# Patient Record
Sex: Male | Born: 1970 | Race: Black or African American | Hispanic: No | Marital: Married | Smoking: Never smoker
Health system: Southern US, Community
[De-identification: ages and names within clinical notes are randomized; demographics above are authoritative.]

## PROBLEM LIST (undated history)

## (undated) DIAGNOSIS — E785 Hyperlipidemia, unspecified: Secondary | ICD-10-CM

## (undated) DIAGNOSIS — Z8249 Family history of ischemic heart disease and other diseases of the circulatory system: Secondary | ICD-10-CM

## (undated) DIAGNOSIS — I2584 Coronary atherosclerosis due to calcified coronary lesion: Secondary | ICD-10-CM

## (undated) DIAGNOSIS — K429 Umbilical hernia without obstruction or gangrene: Secondary | ICD-10-CM

## (undated) DIAGNOSIS — I10 Essential (primary) hypertension: Secondary | ICD-10-CM

## (undated) DIAGNOSIS — K439 Ventral hernia without obstruction or gangrene: Secondary | ICD-10-CM

## (undated) DIAGNOSIS — K219 Gastro-esophageal reflux disease without esophagitis: Secondary | ICD-10-CM

## (undated) DIAGNOSIS — I340 Nonrheumatic mitral (valve) insufficiency: Secondary | ICD-10-CM

## (undated) DIAGNOSIS — I493 Ventricular premature depolarization: Secondary | ICD-10-CM

## (undated) DIAGNOSIS — G4733 Obstructive sleep apnea (adult) (pediatric): Secondary | ICD-10-CM

## (undated) DIAGNOSIS — M5020 Other cervical disc displacement, unspecified cervical region: Secondary | ICD-10-CM

## (undated) DIAGNOSIS — D171 Benign lipomatous neoplasm of skin and subcutaneous tissue of trunk: Secondary | ICD-10-CM

## (undated) DIAGNOSIS — I2510 Atherosclerotic heart disease of native coronary artery without angina pectoris: Secondary | ICD-10-CM

## (undated) HISTORY — DX: Hyperlipidemia, unspecified: E78.5

## (undated) HISTORY — PX: NO PAST SURGERIES: SHX2092

## (undated) HISTORY — DX: Family history of ischemic heart disease and other diseases of the circulatory system: Z82.49

## (undated) HISTORY — DX: Atherosclerotic heart disease of native coronary artery without angina pectoris: I25.10

## (undated) HISTORY — DX: Ventricular premature depolarization: I49.3

## (undated) HISTORY — DX: Coronary atherosclerosis due to calcified coronary lesion: I25.84

## (undated) HISTORY — DX: Nonrheumatic mitral (valve) insufficiency: I34.0

## (undated) HISTORY — DX: Obstructive sleep apnea (adult) (pediatric): G47.33

---

## 2017-03-04 DIAGNOSIS — K439 Ventral hernia without obstruction or gangrene: Secondary | ICD-10-CM | POA: Insufficient documentation

## 2017-03-04 DIAGNOSIS — Z79899 Other long term (current) drug therapy: Secondary | ICD-10-CM | POA: Diagnosis not present

## 2017-03-04 DIAGNOSIS — I10 Essential (primary) hypertension: Secondary | ICD-10-CM | POA: Diagnosis not present

## 2017-03-04 DIAGNOSIS — R1084 Generalized abdominal pain: Secondary | ICD-10-CM | POA: Diagnosis present

## 2017-03-05 ENCOUNTER — Emergency Department (HOSPITAL_COMMUNITY)
Admission: EM | Admit: 2017-03-05 | Discharge: 2017-03-05 | Disposition: A | Discharge status: Home / Self Care | Payer: BLUE CROSS/BLUE SHIELD | Attending: Emergency Medicine | Admitting: Emergency Medicine

## 2017-03-05 ENCOUNTER — Encounter (HOSPITAL_COMMUNITY): Payer: Self-pay | Admitting: Emergency Medicine

## 2017-03-05 ENCOUNTER — Emergency Department (HOSPITAL_COMMUNITY): Payer: BLUE CROSS/BLUE SHIELD

## 2017-03-05 DIAGNOSIS — R1084 Generalized abdominal pain: Secondary | ICD-10-CM

## 2017-03-05 DIAGNOSIS — K439 Ventral hernia without obstruction or gangrene: Secondary | ICD-10-CM

## 2017-03-05 HISTORY — DX: Essential (primary) hypertension: I10

## 2017-03-05 LAB — I-STAT CG4 LACTIC ACID, ED: LACTIC ACID, VENOUS: 1.05 mmol/L (ref 0.5–1.9)

## 2017-03-05 LAB — CBC
HCT: 37.1 % — ABNORMAL LOW (ref 39.0–52.0)
HEMOGLOBIN: 12.6 g/dL — AB (ref 13.0–17.0)
MCH: 28.3 pg (ref 26.0–34.0)
MCHC: 34 g/dL (ref 30.0–36.0)
MCV: 83.4 fL (ref 78.0–100.0)
Platelets: 238 10*3/uL (ref 150–400)
RBC: 4.45 MIL/uL (ref 4.22–5.81)
RDW: 14.2 % (ref 11.5–15.5)
WBC: 6.1 10*3/uL (ref 4.0–10.5)

## 2017-03-05 LAB — URINALYSIS, ROUTINE W REFLEX MICROSCOPIC
BILIRUBIN URINE: NEGATIVE
Glucose, UA: NEGATIVE mg/dL
Hgb urine dipstick: NEGATIVE
Ketones, ur: NEGATIVE mg/dL
LEUKOCYTES UA: NEGATIVE
NITRITE: NEGATIVE
Protein, ur: NEGATIVE mg/dL
SPECIFIC GRAVITY, URINE: 1.02 (ref 1.005–1.030)
pH: 6 (ref 5.0–8.0)

## 2017-03-05 LAB — COMPREHENSIVE METABOLIC PANEL
ALK PHOS: 33 U/L — AB (ref 38–126)
ALT: 30 U/L (ref 17–63)
ANION GAP: 8 (ref 5–15)
AST: 32 U/L (ref 15–41)
Albumin: 4.5 g/dL (ref 3.5–5.0)
BILIRUBIN TOTAL: 0.8 mg/dL (ref 0.3–1.2)
BUN: 18 mg/dL (ref 6–20)
CALCIUM: 9.3 mg/dL (ref 8.9–10.3)
CO2: 27 mmol/L (ref 22–32)
Chloride: 102 mmol/L (ref 101–111)
Creatinine, Ser: 1.02 mg/dL (ref 0.61–1.24)
GFR calc non Af Amer: >60 mL/min (ref >60)
Glucose, Bld: 117 mg/dL — ABNORMAL HIGH (ref 65–99)
Potassium: 3.5 mmol/L (ref 3.5–5.1)
Sodium: 137 mmol/L (ref 135–145)
TOTAL PROTEIN: 7.4 g/dL (ref 6.5–8.1)

## 2017-03-05 LAB — LIPASE, BLOOD: Lipase: 23 U/L (ref 11–51)

## 2017-03-05 MED ORDER — IOPAMIDOL (ISOVUE-300) INJECTION 61%
INTRAVENOUS | Status: AC
  Filled 2017-03-05: qty 100

## 2017-03-05 MED ORDER — HYDROCODONE-ACETAMINOPHEN 5-325 MG PO TABS: 1.0000 | ORAL_TABLET | Freq: Four times a day (QID) | ORAL | 0 refills | Status: DC | PRN

## 2017-03-05 MED ORDER — OXYCODONE-ACETAMINOPHEN 5-325 MG PO TABS
2.0000 | ORAL_TABLET | Freq: Once | ORAL | Status: AC
  Administered 2017-03-05: 2 via ORAL
  Filled 2017-03-05: qty 2

## 2017-03-05 MED ORDER — MORPHINE SULFATE (PF) 2 MG/ML IV SOLN
4.0000 mg | Freq: Once | INTRAVENOUS | Status: AC
Start: 2017-03-05 — End: 2017-03-05
  Administered 2017-03-05: 4 mg via INTRAVENOUS
  Filled 2017-03-05: qty 2

## 2017-03-05 MED ORDER — ONDANSETRON 8 MG PO TBDP: ORAL_TABLET | ORAL | 0 refills | Status: DC

## 2017-03-05 MED ORDER — IOPAMIDOL (ISOVUE-300) INJECTION 61%
100.0000 mL | Freq: Once | INTRAVENOUS | Status: AC | PRN
  Administered 2017-03-05: 100 mL via INTRAVENOUS

## 2017-03-05 NOTE — Discharge Instructions (Signed)
1. Medications: zofran, vicodin, usual home medications 2. Treatment: rest, drink plenty of fluids, advance diet slowly 3. Follow Up: Please followup with your primary doctor and general surgery in 2-3 days for discussion of your diagnoses and further evaluation after today's visit; if you do not have a primary care doctor use the resource guide provided to find one; Please return to the ER for persistent vomiting, high fevers, uncontrolled pain, signs of skin infection or worsening symptoms

## 2017-03-05 NOTE — ED Notes (Signed)
Patient transported to CT 

## 2017-03-05 NOTE — ED Provider Notes (Signed)
[facility] DEPT Provider Note   CSN: [id number] Arrival date & time: 03/04/17  2326     History   Chief Complaint Chief Complaint  Patient presents with  . Abdominal Pain    HPI Phillip Fletcher is a 46 y.o. male with a hx of HTN presents to the Emergency Department complaining of gradual, persistent, progressively worsening generalized abdominal pain onset 7pm.  Pt reports his pain is sharp and stabbing rated at a "20/10."  Pt reports he was running when the pain began.  He states it is normal for his to run.  Pt reports a known umbilical hernia, without previous complications.  He reports no international travel, sick contacts, or abd surgeries.  No hx of diverticulitis or SBO.  Pt reports no treatments PTA.  No melena, hematochezia, hematuria, dysuria, painful BMs, constipation.  Nothing makes the symptoms better or worse. Pt reports feeling of abd distension.       The history is provided by the patient and medical records. No language interpreter was used.    Past Medical History:  Diagnosis Date  . Hypertension     There are no active problems to display for this patient.   No past surgical history on file.     Home Medications    Prior to Admission medications   Medication Sig Start Date End Date Taking? Authorizing Provider  amLODipine (NORVASC) 10 MG tablet Take 1 tablet by mouth daily. 02/22/17  Yes [provider]  atorvastatin (LIPITOR) 20 MG tablet Take 1 tablet by mouth daily. 02/22/17  Yes [provider]  gabapentin (NEURONTIN) 300 MG capsule Take 1 capsule by mouth daily. 02/21/17  Yes [provider]  losartan-hydrochlorothiazide (HYZAAR) 100-25 MG tablet Take 1 tablet by mouth daily. 02/23/17  Yes [provider]  meloxicam (MOBIC) 15 MG tablet Take 1 tablet by mouth daily. 02/22/17  Yes [provider]  HYDROcodone-acetaminophen (NORCO/VICODIN) 5-325 MG tablet Take 1-2 tablets by mouth every 6 (six) hours as  needed. 03/05/17   Sherwood Castilla, Jarrett Soho, PA-C  ondansetron (ZOFRAN ODT) 8 MG disintegrating tablet 8mg  ODT q4 hours prn nausea 03/05/17   Pilar Corrales, Jarrett Soho, PA-C    Family History No family history on file.  Social History Social History  Substance Use Topics  . Smoking status: Never Smoker  . Smokeless tobacco: Never Used  . Alcohol use No     Allergies   Patient has no known allergies.   Review of Systems Review of Systems  Constitutional: Negative for appetite change, diaphoresis, fatigue, fever and unexpected weight change.  HENT: Negative for mouth sores.   Eyes: Negative for visual disturbance.  Respiratory: Negative for cough, chest tightness, shortness of breath and wheezing.   Cardiovascular: Negative for chest pain.  Gastrointestinal: Positive for abdominal distention and abdominal pain. Negative for constipation, diarrhea, nausea and vomiting.  Endocrine: Negative for polydipsia, polyphagia and polyuria.  Genitourinary: Negative for dysuria, frequency, hematuria and urgency.  Musculoskeletal: Negative for back pain and neck stiffness.  Skin: Negative for rash.  Allergic/Immunologic: Negative for immunocompromised state.  Neurological: Negative for syncope, light-headedness and headaches.  Hematological: Does not bruise/bleed easily.  Psychiatric/Behavioral: Negative for sleep disturbance. The patient is not nervous/anxious.   All other systems reviewed and are negative.    Physical Exam Updated Vital Signs BP (!) 170/115 (BP Location: Left Arm)   Pulse 98   Temp 99 F (37.2 C) (Oral)   Resp 18   Ht 5\' 11"  (1.803 m)   Wt 99.8  kg (220 lb)   SpO2 100%   BMI 30.68 kg/m   Physical Exam  Constitutional: He appears well-developed and well-nourished. He appears distressed ( pt appears uncomfortable, pacing around the room).  Awake, alert, nontoxic appearance  HENT:  Head: Normocephalic and atraumatic.  Mouth/Throat: Oropharynx is clear and moist. No  oropharyngeal exudate.  Eyes: Conjunctivae are normal. No scleral icterus.  Neck: Normal range of motion. Neck supple.  Cardiovascular: Normal rate, regular rhythm and intact distal pulses.   Pulmonary/Chest: Effort normal and breath sounds normal. No respiratory distress. He has no wheezes.  Equal chest expansion  Abdominal: Soft. Bowel sounds are normal. He exhibits no mass. There is tenderness in the periumbilical area. There is no rigidity, no rebound, no guarding, no CVA tenderness, no tenderness at McBurney's point and negative Murphy's sign.    Musculoskeletal: Normal range of motion. He exhibits no edema.  Neurological: He is alert.  Speech is clear and goal oriented Moves extremities without ataxia  Skin: Skin is warm and dry. He is not diaphoretic.  Psychiatric: He has a normal mood and affect.  Nursing note and vitals reviewed.    ED Treatments / Results  Labs (all labs ordered are listed, but only abnormal results are displayed) Labs Reviewed  COMPREHENSIVE METABOLIC PANEL - Abnormal; Notable for the following:       Result Value   Glucose, Bld 117 (*)    Alkaline Phosphatase 33 (*)    All other components within normal limits  CBC - Abnormal; Notable for the following:    Hemoglobin 12.6 (*)    HCT 37.1 (*)    All other components within normal limits  LIPASE, BLOOD  URINALYSIS, ROUTINE W REFLEX MICROSCOPIC  I-STAT CG4 LACTIC ACID, ED    Radiology Ct Abdomen Pelvis W Contrast  Result Date: 03/05/2017 CLINICAL DATA:  Abdominal pain.  Abdominal wall hernia. EXAM: CT ABDOMEN AND PELVIS WITH CONTRAST TECHNIQUE: Multidetector CT imaging of the abdomen and pelvis was performed using the standard protocol following bolus administration of intravenous contrast. CONTRAST:  14mL ISOVUE-300 IOPAMIDOL (ISOVUE-300) INJECTION 61% COMPARISON:  None. FINDINGS: Lower chest: Hypoventilatory changes at the lung bases. No pleural fluid. No consolidation. Hepatobiliary: No focal  liver abnormality is seen. No gallstones, gallbladder wall thickening, or biliary dilatation. Pancreas: No ductal dilatation or inflammation. Spleen: Normal in size without focal abnormality. Adrenals/Urinary Tract: Normal adrenal glands. No hydronephrosis or perinephric edema. Homogeneous renal enhancement with symmetric excretion on delayed phase imaging. Urinary bladder is minimally distended. Stomach/Bowel: Stomach is physiologically distended. No bowel obstruction or inflammation. Fecalization of distal small bowel contents suggest slow transit. Small to moderate colonic stool burden. Minimal diverticulosis of the sigmoid colon without diverticulitis. Normal appendix. Vascular/Lymphatic: No significant vascular findings are present. No enlarged abdominal or pelvic lymph nodes. Reproductive: Prostate is unremarkable. Other: Small fat containing umbilical hernia. Midline supraumbilical ventral abdominal wall hernia contains fat and possibly mesenteric vessels, no bowel involvement. Mild surrounding soft tissue inflammation. Hernia neck measures 6 mm. No free air. No ascites. Normal fat in both inguinal canals. Musculoskeletal: There are no acute or suspicious osseous abnormalities. IMPRESSION: 1. Small midline supraumbilical ventral abdominal wall hernia contains fat and possibly mesenteric vessels. No bowel involvement. Minimal surrounding soft tissue stranding consistent with inflammation. 2. Tiny fat containing umbilical hernia. 3. Minimal colonic diverticulosis without acute inflammation. Electronically Signed   By: Jeb Levering M.D.   On: 03/05/2017 03:01    Procedures Procedures (including critical care time)  Medications Ordered in  ED Medications  iopamidol (ISOVUE-300) 61 % injection (not administered)  morphine 2 MG/ML injection 4 mg (4 mg Intravenous Given 03/05/17 0156)  iopamidol (ISOVUE-300) 61 % injection 100 mL (100 mLs Intravenous Contrast Given 03/05/17 0241)     Initial  Impression / Assessment and Plan / ED Course  I have reviewed the triage vital signs and the nursing notes.  Pertinent labs & imaging results that were available during my care of the patient were reviewed by me and considered in my medical decision making (see chart for details).  Clinical Course as of Mar 05 341  Tue Mar 05, 2017  0340 Abdomen remained soft and is not significantly tender upon reexam.  [HM]    Clinical Course User Index [HM] Mikesha Migliaccio, Jarrett Soho, [place]    Patient is nontoxic, nonseptic appearing, in no apparent distress.  Patient's pain and other symptoms adequately managed in emergency department.  Fluid bolus given.  Labs, imaging and vitals reviewed.  Patient does not meet the SIRS or Sepsis criteria.  On repeat exam patient does not have a surgical abdomin and there are no peritoneal signs.  No indication of appendicitis, bowel obstruction, bowel perforation, cholecystitis, diverticulitis.  CT scan does show small, midline supraumbilical ventral abdominal wall hernia which contains fat and possibly mesenteric vessels with minimal surrounding soft tissue stranding. No bowel incarceration. No skin changes on exam. Patient discharged home with symptomatic treatment and given strict instructions for follow-up with their general surgery and primary care physician.  I have also discussed reasons to return immediately to the ER.  Patient expresses understanding and agrees with plan.  Patient noted to be hypertensive in the emergency department.  No signs of hypertensive urgency.  Discussed with patient the need for close follow-up and management by their primary care physician.    BP (!) 153/107   Pulse 63   Temp 99 F (37.2 C) (Oral)   Resp 16   Ht 5\' 11"  (1.803 m)   Wt 99.8 kg (220 lb)   SpO2 96%   BMI 30.68 kg/m    Final Clinical Impressions(s) / ED Diagnoses   Final diagnoses:  Generalized abdominal pain  Ventral hernia without obstruction or gangrene    New  Prescriptions New Prescriptions   HYDROCODONE-ACETAMINOPHEN (NORCO/VICODIN) 5-325 MG TABLET    Take 1-2 tablets by mouth every 6 (six) hours as needed.   ONDANSETRON (ZOFRAN ODT) 8 MG DISINTEGRATING TABLET    8mg  ODT q4 hours prn nausea     Agapito Games 03/05/17 1610    Dina Rich Barbette Hair, MD 03/06/17 [phone]

## 2017-03-05 NOTE — ED Triage Notes (Signed)
Patient complaining of abdominal pain that is all over. Patient states this started about 5 hours ago. Patient states he has a hx of a small hernia. Patient states the pain is severe.

## 2017-03-12 ENCOUNTER — Ambulatory Visit: Payer: Self-pay | Admitting: Surgery

## 2017-03-12 NOTE — H&P (Signed)
im Phillip Fletcher 03/12/2017 3:45 PM Location: [facility] Patient #: [id number] DOB: 04/27/1971 Married / Language: English / Race: Black or African American Male  History of Present Illness Phillip Fletcher A. Phillip Windhorst MD; 03/12/2017 5:06 PM) Patient words: hernia   Patient returns for follow-up of his small ventral hernia just above his umbilicus. He was seen last week emergency room due to increasing pain and had a small piece of pre-peritoneal fat that was incarcerated was diagnosed by CT scanning. He got better with medication reduction. He is rated to reschedule his hernia surgery. He also has a left lower quadrant lipoma near his anterior superior iliac spine was removed. He feels well today.  The patient is a 46 year old male.   Problem List/Past Medical [name], CMA; 03/12/2017 3:47 PM) HERNIA, VENTRAL (K43.9)  Past Surgical History [name], CMA; 03/12/2017 3:46 PM) No pertinent past surgical history  Diagnostic Studies History [name], CMA; 03/12/2017 3:46 PM) Colonoscopy 1-5 years ago  Allergies [name], CMA; 03/12/2017 3:46 PM) No Known Drug Allergies 09/21/2014  Medication History [name], [place]; 03/12/2017 3:47 PM) AmLODIPine Besylate (10MG  Tablet, Oral) Active. Atorvastatin Calcium (10MG  Tablet, Oral) Active. Losartan Potassium-HCTZ (100-12.5MG  Tablet, Oral) Active. Flonase Allergy Relief (50MCG/ACT Suspension, Nasal) Active. Gabapentin (300MG  Capsule, Oral) Active. Meloxicam (15MG  Tablet, Oral) Active. Medications Reconciled  Social History [name], CMA; 03/12/2017 3:47 PM) Alcohol use Moderate alcohol use. No caffeine use No drug use Tobacco use Never smoker.  Family History [name], CMA; 03/12/2017 3:47 PM) Hypertension Brother, Father, Mother, Sister.  Other Problems [name], CMA; 03/12/2017 3:47 PM) Enlarged Prostate High blood  pressure Hypercholesterolemia    Vitals ([name] CMA; 03/12/2017 3:45 PM) 03/12/2017 3:45 PM Weight: 230.25 lb Height: 71in Body Surface Area: 2.24 m Body Mass Index: 32.11 kg/m  BP: 142/88 (Sitting, Left Arm, Standard)      Physical Exam ([name]; 03/12/2017 5:07 PM)  Chest and Lung Exam Chest and lung exam reveals -quiet, even and easy respiratory effort with no use of accessory muscles and on auscultation, normal breath sounds, no adventitious sounds and normal vocal resonance. Inspection Chest Wall - Normal. Back - normal.  Cardiovascular Cardiovascular examination reveals -on palpation PMI is normal in location and amplitude, no palpable S3 or S4. Normal cardiac borders., normal heart sounds, regular rate and rhythm with no murmurs, carotid auscultation reveals no bruits and normal pedal pulses bilaterally.  Abdomen Note: Reducible ventral hernia just above his umbilicus. Small umbilical hernia is well noted below that reducible. Left lower quadrant 4 cm mobile mass consistent with subcutaneous lipoma  Musculoskeletal Normal Exam - Left-Upper Extremity Strength Normal and Lower Extremity Strength Normal. Normal Exam - Right-Upper Extremity Strength Normal, Lower Extremity Weakness.    Assessment & Plan ([name]; 03/12/2017 4:09 PM)  HERNIA, VENTRAL (K43.9) Impression: SMALL SUPRAUMBILICA VENTRAL HERNIA CAUSING PAIN WITH ACTIVITY. RECOMMEND REPAIR WITH MESH. Patient also has small umbilical hernia in the same position. This can be repaired same time. The risk of hernia repair include bleeding, infection, organ injury, bowel injury, bladder injury, nerve injury recurrent hernia, blood clots, worsening of underlying condition, chronic pain, mesh use, open surgery, death, and the need for other operattions. Pt agrees to proceed  Current Plans The anatomy & physiology of the abdominal wall was discussed. The pathophysiology  of hernias was discussed. Natural history risks without surgery including progeressive enlargement, pain, incarceration, & strangulation was discussed.  Contributors to complications such as smoking, obesity, diabetes, prior surgery, etc were discussed.  I feel the risks of no intervention will lead to serious problems that outweigh the operative risks; therefore, I recommended surgery to reduce and repair the hernia. I explained laparoscopic techniques with possible need for an open approach. I noted the probable use of mesh to patch and/or buttress the hernia repair  Risks such as bleeding, infection, abscess, need for further treatment, heart attack, death, and other risks were discussed. I noted a good likelihood this will help address the problem. Goals of post-operative recovery were discussed as well. Possibility that this will not correct all symptoms was explained. I stressed the importance of low-impact activity, aggressive pain control, avoiding constipation, & not pushing through pain to minimize risk of post-operative chronic pain or injury. Possibility of reherniation especially with smoking, obesity, diabetes, immunosuppression, and other health conditions was discussed. We will work to minimize complications.  An educational handout further explaining the pathology & treatment options was given as well. Questions were answered. The patient expresses understanding & wishes to proceed with surgery.  You are being scheduled for surgery- Our schedulers will call you.  You should hear from our office's scheduling department within 5 working days about the location, date, and time of surgery. We try to make accommodations for patient's preferences in scheduling surgery, but sometimes the OR schedule or the surgeon's schedule prevents [place] from making those accommodations.  If you have not heard from our office [phone]) in 5 working days, call the office and ask for your  surgeon's nurse.  If you have other questions about your diagnosis, plan, or surgery, call the office and ask for your surgeon's nurse.  Pt Education - Pamphlet Given - Hernia Surgery: discussed with patient and provided information. 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 Patient desires excision due to pain. Risk of bleeding, infection, recurrence, need further surgery discussed.

## 2017-03-16 DIAGNOSIS — K429 Umbilical hernia without obstruction or gangrene: Secondary | ICD-10-CM

## 2017-03-16 DIAGNOSIS — K439 Ventral hernia without obstruction or gangrene: Secondary | ICD-10-CM

## 2017-03-16 DIAGNOSIS — D171 Benign lipomatous neoplasm of skin and subcutaneous tissue of trunk: Secondary | ICD-10-CM

## 2017-03-16 HISTORY — DX: Umbilical hernia without obstruction or gangrene: K42.9

## 2017-03-16 HISTORY — DX: Benign lipomatous neoplasm of skin and subcutaneous tissue of trunk: D17.1

## 2017-03-16 HISTORY — DX: Ventral hernia without obstruction or gangrene: K43.9

## 2017-04-04 ENCOUNTER — Encounter (HOSPITAL_BASED_OUTPATIENT_CLINIC_OR_DEPARTMENT_OTHER): Payer: Self-pay | Admitting: *Deleted

## 2017-04-04 NOTE — Pre-Procedure Instructions (Signed)
To come for BMET and EKG; to pick up Ensure pre-surgery drink - to drink by 0515 DOS.

## 2017-04-05 ENCOUNTER — Encounter (HOSPITAL_BASED_OUTPATIENT_CLINIC_OR_DEPARTMENT_OTHER)
Admission: RE | Admit: 2017-04-05 | Discharge: 2017-04-05 | Disposition: A | Discharge status: Home / Self Care | Payer: BLUE CROSS/BLUE SHIELD | Source: Ambulatory Visit | Attending: Surgery | Admitting: Surgery

## 2017-04-05 DIAGNOSIS — Z01818 Encounter for other preprocedural examination: Secondary | ICD-10-CM | POA: Insufficient documentation

## 2017-04-05 DIAGNOSIS — I10 Essential (primary) hypertension: Secondary | ICD-10-CM | POA: Insufficient documentation

## 2017-04-05 LAB — BASIC METABOLIC PANEL
Anion gap: 7 (ref 5–15)
BUN: 17 mg/dL (ref 6–20)
CO2: 26 mmol/L (ref 22–32)
CREATININE: 1.07 mg/dL (ref 0.61–1.24)
Calcium: 9.6 mg/dL (ref 8.9–10.3)
Chloride: 105 mmol/L (ref 101–111)
GFR calc Af Amer: >60 mL/min (ref >60)
Glucose, Bld: 97 mg/dL (ref 65–99)
POTASSIUM: 4 mmol/L (ref 3.5–5.1)
SODIUM: 138 mmol/L (ref 135–145)

## 2017-04-05 NOTE — Progress Notes (Signed)
Ensure pre surgery drink given with instructions to complete by 0515 dos, pt verbalized understanding. 

## 2017-04-05 NOTE — Progress Notes (Addendum)
EKG reviewed by Dr. Deatra Canter, will proceed with surgery as scheduled.  Notified pt to stop all vitamins and fish oil 3 days prior to surgery, pt verbalized understanding.

## 2017-04-10 ENCOUNTER — Ambulatory Visit (HOSPITAL_BASED_OUTPATIENT_CLINIC_OR_DEPARTMENT_OTHER)
Admission: RE | Admit: 2017-04-10 | Discharge: 2017-04-10 | Disposition: A | Discharge status: Home / Self Care | Payer: BLUE CROSS/BLUE SHIELD | Source: Ambulatory Visit | Attending: Surgery | Admitting: Surgery

## 2017-04-10 ENCOUNTER — Encounter (HOSPITAL_BASED_OUTPATIENT_CLINIC_OR_DEPARTMENT_OTHER)
Admission: RE | Disposition: A | Discharge status: Home / Self Care | Payer: BLUE CROSS/BLUE SHIELD | Source: Ambulatory Visit | Attending: Surgery

## 2017-04-10 ENCOUNTER — Encounter (HOSPITAL_BASED_OUTPATIENT_CLINIC_OR_DEPARTMENT_OTHER): Payer: Self-pay | Admitting: Emergency Medicine

## 2017-04-10 ENCOUNTER — Ambulatory Visit (HOSPITAL_BASED_OUTPATIENT_CLINIC_OR_DEPARTMENT_OTHER): Payer: BLUE CROSS/BLUE SHIELD | Admitting: Certified Registered"

## 2017-04-10 DIAGNOSIS — Z791 Long term (current) use of non-steroidal anti-inflammatories (NSAID): Secondary | ICD-10-CM | POA: Diagnosis not present

## 2017-04-10 DIAGNOSIS — Z79899 Other long term (current) drug therapy: Secondary | ICD-10-CM | POA: Insufficient documentation

## 2017-04-10 DIAGNOSIS — E669 Obesity, unspecified: Secondary | ICD-10-CM | POA: Insufficient documentation

## 2017-04-10 DIAGNOSIS — K219 Gastro-esophageal reflux disease without esophagitis: Secondary | ICD-10-CM | POA: Diagnosis not present

## 2017-04-10 DIAGNOSIS — I10 Essential (primary) hypertension: Secondary | ICD-10-CM | POA: Diagnosis not present

## 2017-04-10 DIAGNOSIS — D171 Benign lipomatous neoplasm of skin and subcutaneous tissue of trunk: Secondary | ICD-10-CM | POA: Insufficient documentation

## 2017-04-10 DIAGNOSIS — E7800 Pure hypercholesterolemia, unspecified: Secondary | ICD-10-CM | POA: Insufficient documentation

## 2017-04-10 DIAGNOSIS — Z6831 Body mass index (BMI) 31.0-31.9, adult: Secondary | ICD-10-CM | POA: Insufficient documentation

## 2017-04-10 DIAGNOSIS — K420 Umbilical hernia with obstruction, without gangrene: Secondary | ICD-10-CM | POA: Diagnosis not present

## 2017-04-10 DIAGNOSIS — E785 Hyperlipidemia, unspecified: Secondary | ICD-10-CM | POA: Diagnosis not present

## 2017-04-10 DIAGNOSIS — Z7951 Long term (current) use of inhaled steroids: Secondary | ICD-10-CM | POA: Diagnosis not present

## 2017-04-10 DIAGNOSIS — K436 Other and unspecified ventral hernia with obstruction, without gangrene: Secondary | ICD-10-CM | POA: Diagnosis present

## 2017-04-10 HISTORY — DX: Gastro-esophageal reflux disease without esophagitis: K21.9

## 2017-04-10 HISTORY — DX: Other cervical disc displacement, unspecified cervical region: M50.20

## 2017-04-10 HISTORY — DX: Benign lipomatous neoplasm of skin and subcutaneous tissue of trunk: D17.1

## 2017-04-10 HISTORY — DX: Umbilical hernia without obstruction or gangrene: K42.9

## 2017-04-10 HISTORY — PX: VENTRAL HERNIA REPAIR: SHX424

## 2017-04-10 HISTORY — PX: LIPOMA EXCISION: SHX5283

## 2017-04-10 HISTORY — PX: INSERTION OF MESH: SHX5868

## 2017-04-10 HISTORY — DX: Ventral hernia without obstruction or gangrene: K43.9

## 2017-04-10 HISTORY — PX: UMBILICAL HERNIA REPAIR: SHX196

## 2017-04-10 SURGERY — REPAIR, HERNIA, VENTRAL
Anesthesia: General | Site: Flank

## 2017-04-10 MED ORDER — ROCURONIUM BROMIDE 100 MG/10ML IV SOLN
INTRAVENOUS | Status: DC | PRN
  Administered 2017-04-10: 50 mg via INTRAVENOUS
  Administered 2017-04-10: 20 mg via INTRAVENOUS

## 2017-04-10 MED ORDER — GABAPENTIN 300 MG PO CAPS
ORAL_CAPSULE | ORAL | Status: AC
  Filled 2017-04-10: qty 1

## 2017-04-10 MED ORDER — PROMETHAZINE HCL 25 MG/ML IJ SOLN: 6.2500 mg | INTRAMUSCULAR | Status: DC | PRN

## 2017-04-10 MED ORDER — ACETAMINOPHEN 500 MG PO TABS
1000.0000 mg | ORAL_TABLET | ORAL | Status: AC
  Administered 2017-04-10: 1000 mg via ORAL

## 2017-04-10 MED ORDER — SUGAMMADEX SODIUM 200 MG/2ML IV SOLN
INTRAVENOUS | Status: DC | PRN
  Administered 2017-04-10: 400 mg via INTRAVENOUS

## 2017-04-10 MED ORDER — PROPOFOL 500 MG/50ML IV EMUL
INTRAVENOUS | Status: AC
  Filled 2017-04-10: qty 50

## 2017-04-10 MED ORDER — EPHEDRINE SULFATE 50 MG/ML IJ SOLN
INTRAMUSCULAR | Status: DC | PRN
  Administered 2017-04-10: 10 mg via INTRAVENOUS

## 2017-04-10 MED ORDER — GABAPENTIN 300 MG PO CAPS
300.0000 mg | ORAL_CAPSULE | ORAL | Status: AC
  Administered 2017-04-10: 300 mg via ORAL

## 2017-04-10 MED ORDER — MIDAZOLAM HCL 2 MG/2ML IJ SOLN: 1.0000 mg | INTRAMUSCULAR | Status: DC | PRN

## 2017-04-10 MED ORDER — LIDOCAINE 2% (20 MG/ML) 5 ML SYRINGE
INTRAMUSCULAR | Status: AC
  Filled 2017-04-10: qty 5

## 2017-04-10 MED ORDER — FENTANYL CITRATE (PF) 100 MCG/2ML IJ SOLN
INTRAMUSCULAR | Status: AC
  Filled 2017-04-10: qty 2

## 2017-04-10 MED ORDER — HYDROCODONE-ACETAMINOPHEN 5-325 MG PO TABS
2.0000 | ORAL_TABLET | Freq: Once | ORAL | Status: AC
  Administered 2017-04-10: 2 via ORAL

## 2017-04-10 MED ORDER — CEFAZOLIN SODIUM-DEXTROSE 2-4 GM/100ML-% IV SOLN
2.0000 g | INTRAVENOUS | Status: AC
  Administered 2017-04-10: 2 g via INTRAVENOUS

## 2017-04-10 MED ORDER — ONDANSETRON HCL 4 MG/2ML IJ SOLN
INTRAMUSCULAR | Status: AC
  Filled 2017-04-10: qty 4

## 2017-04-10 MED ORDER — FENTANYL CITRATE (PF) 100 MCG/2ML IJ SOLN
50.0000 ug | INTRAMUSCULAR | Status: DC | PRN
  Administered 2017-04-10: 100 ug via INTRAVENOUS

## 2017-04-10 MED ORDER — IBUPROFEN 800 MG PO TABS: 800.0000 mg | ORAL_TABLET | Freq: Three times a day (TID) | ORAL | 0 refills | Status: DC | PRN

## 2017-04-10 MED ORDER — PROPOFOL 10 MG/ML IV BOLUS
INTRAVENOUS | Status: DC | PRN
  Administered 2017-04-10: 200 mg via INTRAVENOUS

## 2017-04-10 MED ORDER — DEXAMETHASONE SODIUM PHOSPHATE 4 MG/ML IJ SOLN
INTRAMUSCULAR | Status: DC | PRN
  Administered 2017-04-10: 10 mg via INTRAVENOUS

## 2017-04-10 MED ORDER — SCOPOLAMINE 1 MG/3DAYS TD PT72
1.0000 | MEDICATED_PATCH | Freq: Once | TRANSDERMAL | Status: DC | PRN
Start: 2017-04-10 — End: 2017-04-10

## 2017-04-10 MED ORDER — CHLORHEXIDINE GLUCONATE CLOTH 2 % EX PADS: 6.0000 | MEDICATED_PAD | Freq: Once | CUTANEOUS | Status: DC

## 2017-04-10 MED ORDER — ONDANSETRON HCL 4 MG/2ML IJ SOLN
INTRAMUSCULAR | Status: DC | PRN
  Administered 2017-04-10: 4 mg via INTRAVENOUS

## 2017-04-10 MED ORDER — BUPIVACAINE-EPINEPHRINE (PF) 0.25% -1:200000 IJ SOLN
INTRAMUSCULAR | Status: AC
Start: 2017-04-10 — End: 2017-04-10
  Filled 2017-04-10: qty 30

## 2017-04-10 MED ORDER — HYDROCODONE-ACETAMINOPHEN 5-325 MG PO TABS: 1.0000 | ORAL_TABLET | Freq: Four times a day (QID) | ORAL | 0 refills | Status: DC | PRN

## 2017-04-10 MED ORDER — MIDAZOLAM HCL 2 MG/2ML IJ SOLN
INTRAMUSCULAR | Status: AC
  Filled 2017-04-10: qty 2

## 2017-04-10 MED ORDER — DEXAMETHASONE SODIUM PHOSPHATE 10 MG/ML IJ SOLN
INTRAMUSCULAR | Status: AC
  Filled 2017-04-10: qty 1

## 2017-04-10 MED ORDER — CEFAZOLIN SODIUM-DEXTROSE 2-4 GM/100ML-% IV SOLN
INTRAVENOUS | Status: AC
  Filled 2017-04-10: qty 100

## 2017-04-10 MED ORDER — SUGAMMADEX SODIUM 200 MG/2ML IV SOLN
INTRAVENOUS | Status: AC
  Filled 2017-04-10: qty 2

## 2017-04-10 MED ORDER — BUPIVACAINE-EPINEPHRINE (PF) 0.25% -1:200000 IJ SOLN
INTRAMUSCULAR | Status: DC | PRN
  Administered 2017-04-10: 15 mL

## 2017-04-10 MED ORDER — LIDOCAINE HCL (CARDIAC) 20 MG/ML IV SOLN
INTRAVENOUS | Status: DC | PRN
  Administered 2017-04-10: 60 mg via INTRAVENOUS

## 2017-04-10 MED ORDER — ACETAMINOPHEN 500 MG PO TABS
ORAL_TABLET | ORAL | Status: AC
  Filled 2017-04-10: qty 2

## 2017-04-10 MED ORDER — LACTATED RINGERS IV SOLN
INTRAVENOUS | Status: DC
  Administered 2017-04-10 (×2): via INTRAVENOUS

## 2017-04-10 MED ORDER — HYDROCODONE-ACETAMINOPHEN 5-325 MG PO TABS
ORAL_TABLET | ORAL | Status: AC
  Filled 2017-04-10: qty 2

## 2017-04-10 MED ORDER — FENTANYL CITRATE (PF) 100 MCG/2ML IJ SOLN
25.0000 ug | INTRAMUSCULAR | Status: DC | PRN
  Administered 2017-04-10: 25 ug via INTRAVENOUS
  Administered 2017-04-10: 50 ug via INTRAVENOUS

## 2017-04-10 SURGICAL SUPPLY — 31 items
BLADE CLIPPER SURG (BLADE) ×4 IMPLANT
BLADE SURG 15 STRL LF DISP TIS (BLADE) ×2 IMPLANT
BLADE SURG 15 STRL SS (BLADE) ×2
CHLORAPREP W/TINT 26ML (MISCELLANEOUS) ×4 IMPLANT
CLEANER CAUTERY TIP 5X5 PAD (MISCELLANEOUS) ×2 IMPLANT
COVER BACK TABLE 60X90IN (DRAPES) ×4 IMPLANT
COVER MAYO STAND STRL (DRAPES) ×4 IMPLANT
DERMABOND ADVANCED (GAUZE/BANDAGES/DRESSINGS) ×2
DERMABOND ADVANCED .7 DNX12 (GAUZE/BANDAGES/DRESSINGS) ×2 IMPLANT
DRAPE LAPAROTOMY TRNSV 102X78 (DRAPE) ×4 IMPLANT
DRAPE UTILITY XL STRL (DRAPES) ×4 IMPLANT
ELECT REM PT RETURN 9FT ADLT (ELECTROSURGICAL) ×4
ELECTRODE REM PT RTRN 9FT ADLT (ELECTROSURGICAL) ×2 IMPLANT
GLOVE BIOGEL PI IND STRL 8 (GLOVE) ×2 IMPLANT
GLOVE BIOGEL PI INDICATOR 8 (GLOVE) ×2
GLOVE ECLIPSE 8.0 STRL XLNG CF (GLOVE) ×4 IMPLANT
GOWN STRL REUS W/ TWL LRG LVL3 (GOWN DISPOSABLE) ×4 IMPLANT
GOWN STRL REUS W/TWL LRG LVL3 (GOWN DISPOSABLE) ×4
MESH VENTRALEX ST 1-7/10 CRC S (Mesh General) ×4 IMPLANT
NEEDLE HYPO 25X1 1.5 SAFETY (NEEDLE) ×4 IMPLANT
NS IRRIG 1000ML POUR BTL (IV SOLUTION) ×4 IMPLANT
PACK BASIN DAY SURGERY FS (CUSTOM PROCEDURE TRAY) ×4 IMPLANT
PAD CLEANER CAUTERY TIP 5X5 (MISCELLANEOUS) ×2
PENCIL BUTTON HOLSTER BLD 10FT (ELECTRODE) ×4 IMPLANT
SLEEVE SCD COMPRESS KNEE MED (MISCELLANEOUS) IMPLANT
SPONGE LAP 4X18 X RAY DECT (DISPOSABLE) ×4 IMPLANT
SUT MON AB 4-0 PC3 18 (SUTURE) ×8 IMPLANT
SUT NOVA NAB GS-21 1 T12 (SUTURE) ×12 IMPLANT
SUT VICRYL 3-0 CR8 SH (SUTURE) ×4 IMPLANT
SYR CONTROL 10ML LL (SYRINGE) ×4 IMPLANT
TOWEL OR 17X24 6PK STRL BLUE (TOWEL DISPOSABLE) ×4 IMPLANT

## 2017-04-10 NOTE — Interval H&P Note (Signed)
History and Physical Interval Note:  04/10/2017 8:13 AM  Phillip Fletcher  has presented today for surgery, with the diagnosis of ventral hernia, umbilical hernia, lipoma   The various methods of treatment have been discussed with the patient and family. After consideration of risks, benefits and other options for treatment, the patient has consented to  Procedure(s): REPAIR VENTRAL HERNIA, UMBILICAL HERNIA WITH MESH, EXCISION OF LIPOMA LEFT FLANK  (N/A) INSERTION OF MESH (N/A) as a surgical intervention .  The patient's history has been reviewed, patient examined, no change in status, stable for surgery.  I have reviewed the patient's chart and labs.  Questions were answered to the patient's satisfaction.     [name]

## 2017-04-10 NOTE — H&P (View-Only) (Signed)
im Phillip Fletcher 03/12/2017 3:45 PM Location: [facility] Patient #: [id number] DOB: 03/11/1971 Married / Language: English / Race: Black or African American Male  History of Present Illness Phillip Moores A. [name] MD; 03/12/2017 5:06 PM) Patient words: hernia   Patient returns for follow-up of his small ventral hernia just above his umbilicus. He was seen last week emergency room due to increasing pain and had a small piece of pre-peritoneal fat that was incarcerated was diagnosed by CT scanning. He got better with medication reduction. He is rated to reschedule his hernia surgery. He also has a left lower quadrant lipoma near his anterior superior iliac spine was removed. He feels well today.  The patient is a 46 year old male.   Problem List/Past Medical [name], CMA; 03/12/2017 3:47 PM) HERNIA, VENTRAL (K43.9)  Past Surgical History [name], CMA; 03/12/2017 3:46 PM) No pertinent past surgical history  Diagnostic Studies History [name], CMA; 03/12/2017 3:46 PM) Colonoscopy 1-5 years ago  Allergies [name], CMA; 03/12/2017 3:46 PM) No Known Drug Allergies 09/21/2014  Medication History [name], [name]; 03/12/2017 3:47 PM) AmLODIPine Besylate (10MG  Tablet, Oral) Active. Atorvastatin Calcium (10MG  Tablet, Oral) Active. Losartan Potassium-HCTZ (100-12.5MG  Tablet, Oral) Active. Flonase Allergy Relief (50MCG/ACT Suspension, Nasal) Active. Gabapentin (300MG  Capsule, Oral) Active. Meloxicam (15MG  Tablet, Oral) Active. Medications Reconciled  Social History [name], CMA; 03/12/2017 3:47 PM) Alcohol use Moderate alcohol use. No caffeine use No drug use Tobacco use Never smoker.  Family History [name], CMA; 03/12/2017 3:47 PM) Hypertension Brother, Father, Mother, Sister.  Other Problems [name], CMA; 03/12/2017 3:47 PM) Enlarged Prostate High blood  pressure Hypercholesterolemia    Vitals ([name] CMA; 03/12/2017 3:45 PM) 03/12/2017 3:45 PM Weight: 230.25 lb Height: 71in Body Surface Area: 2.24 m Body Mass Index: 32.11 kg/m  BP: 142/88 (Sitting, Left Arm, Standard)      Physical Exam ([name] MD; 03/12/2017 5:07 PM)  Chest and Lung Exam Chest and lung exam reveals -quiet, even and easy respiratory effort with no use of accessory muscles and on auscultation, normal breath sounds, no adventitious sounds and normal vocal resonance. Inspection Chest Wall - Normal. Back - normal.  Cardiovascular Cardiovascular examination reveals -on palpation PMI is normal in location and amplitude, no palpable S3 or S4. Normal cardiac borders., normal heart sounds, regular rate and rhythm with no murmurs, carotid auscultation reveals no bruits and normal pedal pulses bilaterally.  Abdomen Note: Reducible ventral hernia just above his umbilicus. Small umbilical hernia is well noted below that reducible. Left lower quadrant 4 cm mobile mass consistent with subcutaneous lipoma  Musculoskeletal Normal Exam - Left-Upper Extremity Strength Normal and Lower Extremity Strength Normal. Normal Exam - Right-Upper Extremity Strength Normal, Lower Extremity Weakness.    Assessment & Plan ([name] MD; 03/12/2017 4:09 PM)  HERNIA, VENTRAL (K43.9) Impression: SMALL SUPRAUMBILICA VENTRAL HERNIA CAUSING PAIN WITH ACTIVITY. RECOMMEND REPAIR WITH MESH. Patient also has small umbilical hernia in the same position. This can be repaired same time. The risk of hernia repair include bleeding, infection, organ injury, bowel injury, bladder injury, nerve injury recurrent hernia, blood clots, worsening of underlying condition, chronic pain, mesh use, open surgery, death, and the need for other operattions. Pt agrees to proceed  Current Plans The anatomy & physiology of the abdominal wall was discussed. The pathophysiology  of hernias was discussed. Natural history risks without surgery including progeressive enlargement, pain, incarceration, & strangulation was discussed.  Contributors to complications such as smoking, obesity, diabetes, prior surgery, etc were discussed.  I feel the risks of no intervention will lead to serious problems that outweigh the operative risks; therefore, I recommended surgery to reduce and repair the hernia. I explained laparoscopic techniques with possible need for an open approach. I noted the probable use of mesh to patch and/or buttress the hernia repair  Risks such as bleeding, infection, abscess, need for further treatment, heart attack, death, and other risks were discussed. I noted a good likelihood this will help address the problem. Goals of post-operative recovery were discussed as well. Possibility that this will not correct all symptoms was explained. I stressed the importance of low-impact activity, aggressive pain control, avoiding constipation, & not pushing through pain to minimize risk of post-operative chronic pain or injury. Possibility of reherniation especially with smoking, obesity, diabetes, immunosuppression, and other health conditions was discussed. We will work to minimize complications.  An educational handout further explaining the pathology & treatment options was given as well. Questions were answered. The patient expresses understanding & wishes to proceed with surgery.  You are being scheduled for surgery- Our schedulers will call you.  You should hear from our office's scheduling department within 5 working days about the location, date, and time of surgery. We try to make accommodations for patient's preferences in scheduling surgery, but sometimes the OR schedule or the surgeon's schedule prevents [place] from making those accommodations.  If you have not heard from our office [phone]) in 5 working days, call the office and ask for your  surgeon's nurse.  If you have other questions about your diagnosis, plan, or surgery, call the office and ask for your surgeon's nurse.  Pt Education - Pamphlet Given - Hernia Surgery: discussed with patient and provided information. 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 Patient desires excision due to pain. Risk of bleeding, infection, recurrence, need further surgery discussed.

## 2017-04-10 NOTE — Anesthesia Preprocedure Evaluation (Addendum)
Anesthesia Evaluation  Patient identified by MRN, date of birth, ID band Patient awake    Reviewed: Allergy & Precautions, NPO status , Patient's Chart, lab work & pertinent test results  Airway Mallampati: II  TM Distance: <3 FB Neck ROM: Full    Dental no notable dental hx.    Pulmonary neg pulmonary ROS,    Pulmonary exam normal breath sounds clear to auscultation       Cardiovascular hypertension, Pt. on medications Normal cardiovascular exam Rhythm:Regular Rate:Normal  ECG: NSR, rate 67. LVH   Neuro/Psych negative neurological ROS  negative psych ROS   GI/Hepatic Neg liver ROS, GERD  Medicated,  Endo/Other  Obesity   Renal/GU negative Renal ROS     Musculoskeletal negative musculoskeletal ROS (+)   Abdominal   Peds  Hematology negative hematology ROS (+)   Anesthesia Other Findings HLD  Reproductive/Obstetrics                            Anesthesia Physical Anesthesia Plan  ASA: II  Anesthesia Plan: General   Post-op Pain Management:    Induction: Intravenous  PONV Risk Score and Plan: 2 and Ondansetron, Dexamethasone and Midazolam  Airway Management Planned: Oral ETT  Additional Equipment:   Intra-op Plan:   Post-operative Plan: Extubation in OR  Informed Consent: I have reviewed the patients History and Physical, chart, labs and discussed the procedure including the risks, benefits and alternatives for the proposed anesthesia with the patient or authorized representative who has indicated his/her understanding and acceptance.   Dental advisory given  Plan Discussed with: CRNA  Anesthesia Plan Comments:         Anesthesia Quick Evaluation

## 2017-04-10 NOTE — Transfer of Care (Signed)
Immediate Anesthesia Transfer of Care Note  Patient: Phillip Fletcher  Procedure(s) Performed: Procedure(s): HERNIA REPAIR VENTRAL ADULT (N/A) INSERTION OF MESH (N/A) EXCISION LIPOMA (Left) HERNIA REPAIR UMBILICAL ADULT (N/A)  Patient Location: PACU  Anesthesia Type:General  Level of Consciousness: awake, alert , oriented and patient cooperative  Airway & Oxygen Therapy: Patient Spontanous Breathing and Patient connected to face mask oxygen  Post-op Assessment: Report given to RN and Post -op Vital signs reviewed and stable  Post vital signs: Reviewed and stable  Last Vitals:  Vitals:   04/10/17 0814  BP: (!) 144/89  Pulse: 70  Resp: 18  Temp: 36.5 C  SpO2: 98%    Last Pain:  Vitals:   04/10/17 0814  TempSrc: Oral  PainSc: 0-No pain         Complications: No apparent anesthesia complications

## 2017-04-10 NOTE — Discharge Instructions (Signed)
[facility] _______[facility] ° °UMBILICAL OR INGUINAL HERNIA REPAIR: POST OP INSTRUCTIONS ° °Always review your discharge instruction sheet given to you by the facility where your surgery was performed. °IF YOU HAVE DISABILITY OR FAMILY LEAVE FORMS, YOU MUST BRING THEM TO THE OFFICE FOR PROCESSING.   °DO NOT GIVE THEM TO YOUR DOCTOR. ° °1. A  prescription for pain medication may be given to you upon discharge.  Take your pain medication as prescribed, if needed.  If narcotic pain medicine is not needed, then you may take acetaminophen (Tylenol) or ibuprofen (Advil) as needed. °2. Take your usually prescribed medications unless otherwise directed. °If you need a refill on your pain medication, please contact your pharmacy.  They will contact our office to request authorization. Prescriptions will not be filled after 5 pm or on week-ends. °3. You should follow a light diet the first 24 hours after arrival home, such as soup and crackers, etc.  Be sure to include lots of fluids daily.  Resume your normal diet the day after surgery. °4.Most patients will experience some swelling and bruising around the umbilicus or in the groin and scrotum.  Ice packs and reclining will help.  Swelling and bruising can take several days to resolve.  °6. It is common to experience some constipation if taking pain medication after surgery.  Increasing fluid intake and taking a stool softener (such as Colace) will usually help or prevent this problem from occurring.  A mild laxative (Milk of Magnesia or Miralax) should be taken according to package directions if there are no bowel movements after 48 hours. °7. Unless discharge instructions indicate otherwise, you may remove your bandages 24-48 hours after surgery, and you may shower at that time.  You may have steri-strips (small skin tapes) in place directly over the incision.  These strips should be left on the skin for 7-10 days.  If your surgeon used skin glue on the  incision, you may shower in 24 hours.  The glue will flake off over the next 2-3 weeks.  Any sutures or staples will be removed at the office during your follow-up visit. °8. ACTIVITIES:  You may resume regular (light) daily activities beginning the next day--such as daily self-care, walking, climbing stairs--gradually increasing activities as tolerated.  You may have sexual intercourse when it is comfortable.  Refrain from any heavy lifting or straining until approved by your doctor. ° °a.You may drive when you are no longer taking prescription pain medication, you can comfortably wear a seatbelt, and you can safely maneuver your car and apply brakes. °b.RETURN TO WORK:   °_____________________________________________ ° °9.You should see your doctor in the office for a follow-up appointment approximately 2-3 weeks after your surgery.  Make sure that you call for this appointment within a day or two after you arrive home to insure a convenient appointment time. °10.OTHER INSTRUCTIONS: _________________________ °   _____________________________________ ° °WHEN TO CALL YOUR DOCTOR: °1. Fever over 101.0 °2. Inability to urinate °3. Nausea and/or vomiting °4. Extreme swelling or bruising °5. Continued bleeding from incision. °6. Increased pain, redness, or drainage from the incision ° °The clinic staff is available to answer your questions during regular business hours.  Please don’t hesitate to call and ask to speak to one of the nurses for clinical concerns.  If you have a medical emergency, go to the nearest emergency room or call 911.  A surgeon from [facility] is always on call at the hospital ° ° °  [address], [address], [place], [place]  [address] ? ° [address] [address], [place], [place]   [address] °[phone] ? [phone] ? FAX [phone] °Web site: [email or web address] ° ° °Post Anesthesia Home Care Instructions ° °Activity: °Get plenty of rest for the remainder of the day. A  responsible individual must stay with you for 24 hours following the procedure.  °For the next 24 hours, DO NOT: °-Drive a car °-Operate machinery °-Drink alcoholic beverages °-Take any medication unless instructed by your physician °-Make any legal decisions or sign important papers. ° °Meals: °Start with liquid foods such as gelatin or soup. Progress to regular foods as tolerated. Avoid greasy, spicy, heavy foods. If nausea and/or vomiting occur, drink only clear liquids until the nausea and/or vomiting subsides. Call your physician if vomiting continues. ° °Special Instructions/Symptoms: °Your throat may feel dry or sore from the anesthesia or the breathing tube placed in your throat during surgery. If this causes discomfort, gargle with warm salt water. The discomfort should disappear within 24 hours. ° °If you had a scopolamine patch placed behind your ear for the management of post- operative nausea and/or vomiting: ° °1. The medication in the patch is effective for 72 hours, after which it should be removed.  Wrap patch in a tissue and discard in the trash. Wash hands thoroughly with soap and water. °2. You may remove the patch earlier than 72 hours if you experience unpleasant side effects which may include dry mouth, dizziness or visual disturbances. °3. Avoid touching the patch. Wash your hands with soap and water after contact with the patch. °  ° °

## 2017-04-10 NOTE — Op Note (Signed)
Preoperative diagnosis: #1 incarcerated ventral hernia       # 2 umbilical hernia     #3      5 cm subcutaneous lipoma left lower quadrant subcutaneous abdomen  Postoperative diagnosis: Same  Procedure: #1 repair of ventral hernia with mesh #2 primary repair of umbilical hernia #3 excision of lipoma        Surgeon: Erroll Luna M.D.  Anesthesia: Gen. with local  EBL: Minimal  Specimen lipoma to pathology  Drains: None  Dictations for procedure: The patient presents for repair of a small incarcerated ventral hernia which was detected on emergency room visit CT scan. He also had an umbilical hernia below that. He had a lipoma in his left lower abdomen subcutaneous fat that he wanted excised as well as the increasing size. Risks, benefits and options of surgery were discussed.The risk of hernia repair include bleeding,  Infection,   Recurrence of the hernia,  Mesh use, chronic pain,  Organ injury,  Bowel injury,  Bladder injury,   nerve injury with numbness around the incision,  Death,  and worsening of preexisting  medical problems.  The alternatives to surgery have been discussed as well..  Long term expectations of both operative and non operative treatments have been discussed.   The patient agrees to proceed.The procedure has been discussed with the patient.  Alternative therapies have been discussed with the patient.  Operative risks include bleeding,  Infection,  Organ injury,  Nerve injury,  Blood vessel injury,  DVT,  Pulmonary embolism,  Death,  And possible reoperation.  Medical management risks include worsening of present situation.  The success of the procedure is 50 -90 % at treating patients symptoms.  The patient understands and agrees to proceed.      Description of procedure: The patient was met in the holding area and questions are answered. He was then taken back to the operating room. After induction of general anesthesia his abdomen was prepped and draped in sterile  fashion. Timeout was done. The ventral hernia was addressed first. This was about 5 cm above his umbilicus. A 3 cm incision was made and dissection was carried down to find a 1 cm ventral hernia with incarcerated preperitoneal fat. This was I repaired the defect with a 4.3 cm circular ventral light mesh in a subfascial position secured with #1 Novafil. The fascial defect was closed with #1 Novafil. 3-0 Vicryl for Monocryl used to close the subcutaneous tissues. Given that his umbilical hernia was below this I elected to use a second incision. Curvilinear incision was made along the inferior border is umbilicus. Dissection was carried down 5 mm umbilical defect was encountered. A small piece fat incarcerated within this as well. I closed with figure-of-eight stitch of #1 Novafil. The umbilicus was secured to the fascia with Vicryl for procedure close the skin in a subcuticular fashion.  The lipoma was addressed the left lower quadrant. Transverse incision was made. A 5 cm a lipoma was resected in its entirety. This was contained subcutaneous fat. Subcutaneous tissues were closed with 3-0 Vicryl and 4-0 Monocryl was used to close skin in a subcuticular fashion. Dermabond was applied to all 3 incisions. Local anesthetic was infiltrated around all 3 incisions. All final counts are found to be correct. The patient was awoke and extubated taken to recovery in satisfactory condition.

## 2017-04-10 NOTE — Anesthesia Procedure Notes (Signed)
Procedure Name: Intubation Date/Time: 04/10/2017 8:38 AM Performed by: Jonavan Vanhorn D Pre-anesthesia Checklist: Patient identified, Emergency Drugs available, Suction available and Patient being monitored Patient Re-evaluated:Patient Re-evaluated prior to induction Oxygen Delivery Method: Circle system utilized Preoxygenation: Pre-oxygenation with 100% oxygen Induction Type: IV induction Ventilation: Mask ventilation without difficulty Laryngoscope Size: Mac and 3 Grade View: Grade I Tube type: Oral Number of attempts: 1 Airway Equipment and Method: Stylet and Oral airway Placement Confirmation: ETT inserted through vocal cords under direct vision,  positive ETCO2 and breath sounds checked- equal and bilateral Secured at: 21 cm Tube secured with: Tape Dental Injury: Teeth and Oropharynx as per pre-operative assessment

## 2017-04-11 ENCOUNTER — Encounter (HOSPITAL_BASED_OUTPATIENT_CLINIC_OR_DEPARTMENT_OTHER): Payer: Self-pay | Admitting: Surgery

## 2017-04-11 NOTE — Anesthesia Postprocedure Evaluation (Signed)
Anesthesia Post Note  Patient: Jaylyn Booher  Procedure(s) Performed: Procedure(s) (LRB): HERNIA REPAIR VENTRAL ADULT (N/A) INSERTION OF MESH (N/A) EXCISION LIPOMA (Left) HERNIA REPAIR UMBILICAL ADULT (N/A)     Patient location during evaluation: PACU Anesthesia Type: General Level of consciousness: awake and alert Pain management: pain level controlled Vital Signs Assessment: post-procedure vital signs reviewed and stable Respiratory status: spontaneous breathing, nonlabored ventilation and respiratory function stable Cardiovascular status: blood pressure returned to baseline and stable Postop Assessment: no apparent nausea or vomiting Anesthetic complications: no    Last Vitals:  Vitals:   04/10/17 1030 04/10/17 1112  BP: 133/80 (!) 148/86  Pulse: 90 82  Resp: 19 18  Temp:  37 C  SpO2: 93% 93%    Last Pain:  Vitals:   04/10/17 1112  TempSrc: Oral  PainSc: 4                  Catalina Gravel

## 2021-03-10 DIAGNOSIS — Z125 Encounter for screening for malignant neoplasm of prostate: Secondary | ICD-10-CM | POA: Diagnosis not present

## 2021-03-10 DIAGNOSIS — I10 Essential (primary) hypertension: Secondary | ICD-10-CM | POA: Diagnosis not present

## 2021-03-10 DIAGNOSIS — E782 Mixed hyperlipidemia: Secondary | ICD-10-CM | POA: Diagnosis not present

## 2021-03-10 DIAGNOSIS — Z13228 Encounter for screening for other metabolic disorders: Secondary | ICD-10-CM | POA: Diagnosis not present

## 2021-03-10 DIAGNOSIS — Z131 Encounter for screening for diabetes mellitus: Secondary | ICD-10-CM | POA: Diagnosis not present

## 2021-05-19 DIAGNOSIS — Z0000 Encounter for general adult medical examination without abnormal findings: Secondary | ICD-10-CM | POA: Diagnosis not present

## 2021-05-19 DIAGNOSIS — I10 Essential (primary) hypertension: Secondary | ICD-10-CM | POA: Diagnosis not present

## 2021-05-19 DIAGNOSIS — Z23 Encounter for immunization: Secondary | ICD-10-CM | POA: Diagnosis not present

## 2021-05-19 DIAGNOSIS — E785 Hyperlipidemia, unspecified: Secondary | ICD-10-CM | POA: Diagnosis not present

## 2021-05-19 DIAGNOSIS — Z8249 Family history of ischemic heart disease and other diseases of the circulatory system: Secondary | ICD-10-CM | POA: Diagnosis not present

## 2021-06-07 ENCOUNTER — Ambulatory Visit: Payer: BC Managed Care – PPO | Admitting: Cardiology

## 2021-06-26 ENCOUNTER — Encounter: Payer: Self-pay | Admitting: Cardiology

## 2021-06-26 ENCOUNTER — Other Ambulatory Visit: Payer: Self-pay

## 2021-06-26 ENCOUNTER — Ambulatory Visit: Payer: BC Managed Care – PPO | Admitting: Cardiology

## 2021-06-26 VITALS — BP 135/100 | HR 78 | Resp 16 | Ht 71.0 in | Wt 240.0 lb

## 2021-06-26 DIAGNOSIS — E782 Mixed hyperlipidemia: Secondary | ICD-10-CM

## 2021-06-26 DIAGNOSIS — I10 Essential (primary) hypertension: Secondary | ICD-10-CM

## 2021-06-26 DIAGNOSIS — G4733 Obstructive sleep apnea (adult) (pediatric): Secondary | ICD-10-CM

## 2021-06-26 DIAGNOSIS — E6609 Other obesity due to excess calories: Secondary | ICD-10-CM

## 2021-06-26 DIAGNOSIS — Z6833 Body mass index (BMI) 33.0-33.9, adult: Secondary | ICD-10-CM

## 2021-06-26 DIAGNOSIS — Z8249 Family history of ischemic heart disease and other diseases of the circulatory system: Secondary | ICD-10-CM

## 2021-06-26 NOTE — Progress Notes (Signed)
Date:  06/26/2021   ID:  Phillip Fletcher, DOB 04/08/1971, MRN [id number]  PCP:  [facility]  Cardiologist:  Rex Kras, DO, [facility] (established care 06/26/2021)  REASON FOR CONSULT: Family history of heart disease  REQUESTING PHYSICIAN:  [facility] [address] [place],  [place] [address]  Chief Complaint  Patient presents with   Family history of ischemic heart disease    New Patient (Initial Visit)    HPI  Phillip Fletcher is a 50 y.o. African-American male who presents to the office with a chief complaint of " family history of heart disease." Patient's past medical history and cardiovascular risk factors include: Hypertension, hyperlipidemia, erectile dysfunction, sleep apnea, family history of premature CAD.  He is referred to the office at the request of Nicki Reaper Long at [facility] urgent care for evaluation of family history of heart disease.  Overall relatively healthy 50 year old gentleman who presents to the office for evaluation of heart disease and given his strong family history of premature CAD.  He denies any active chest pain or shortness of breath at rest or with effort related activities.  He has overall good functional capacity without any change in endurance or limitations.  Father at the age of 61 had a myocardial infarction and passed away. Brother at the age of 63 had a myocardial infarction and passed away. Brother at the age of 40 had a myocardial infarction and passed away. Brother at the age of 55 had his first CVA and passed away at the age of 17. Brother at the age of 74 currently alive but has had myocardial infarction, 2 PCI's at least, and AICD (per patient). Sister at the age of 47 has had valve replacement, currently alive.  No known family history of cardiomyopathy and gene testing.  FUNCTIONAL STATUS: Runs at least 2 miles a day in addition to working out additional 30 to 60 minutes 6 days a  week.  ALLERGIES: No Known Allergies  MEDICATION LIST PRIOR TO VISIT: Current Meds  Medication Sig   amLODipine (NORVASC) 10 MG tablet Take 1 tablet by mouth daily.   atorvastatin (LIPITOR) 20 MG tablet Take 1 tablet by mouth daily.   gabapentin (NEURONTIN) 300 MG capsule Take 1 capsule by mouth daily.   HYDROcodone-acetaminophen (NORCO/VICODIN) 5-325 MG tablet Take 1-2 tablets by mouth every 6 (six) hours as needed.   ibuprofen (ADVIL,MOTRIN) 800 MG tablet Take 1 tablet (800 mg total) by mouth every 8 (eight) hours as needed.   losartan-hydrochlorothiazide (HYZAAR) 100-25 MG tablet Take 1 tablet by mouth daily.   Multiple Vitamin (MULTIVITAMIN) tablet Take 1 tablet by mouth daily.   Omega-3 Fatty Acids (FISH OIL PO) Take by mouth.   omeprazole (PRILOSEC) 20 MG capsule Take 20 mg by mouth daily.   sildenafil (VIAGRA) 50 MG tablet Take 50 mg by mouth daily as needed for erectile dysfunction.     PAST MEDICAL HISTORY: Past Medical History:  Diagnosis Date   Family history of premature CAD    GERD (gastroesophageal reflux disease)    HNP (herniated nucleus pulposus), cervical    Hyperlipidemia    Hypertension    states under control with meds., has been on med. x 20 yr.   Lipoma of flank 03/2017   left   OSA on CPAP    Umbilical hernia 81/2751   Ventral hernia 03/2017    PAST SURGICAL HISTORY: Past Surgical History:  Procedure Laterality Date   INSERTION  OF MESH N/A 04/10/2017   Procedure: INSERTION OF MESH;  Surgeon: Erroll Luna, MD;  Location: [place];  Service: General;  Laterality: N/A;   LIPOMA EXCISION Left 04/10/2017   Procedure: EXCISION LIPOMA;  Surgeon: Erroll Luna, MD;  Location: [place];  Service: General;  Laterality: Left;   NO PAST SURGERIES     UMBILICAL HERNIA REPAIR N/A 04/10/2017   Procedure: HERNIA REPAIR UMBILICAL ADULT;  Surgeon: Erroll Luna, MD;  Location: [place];  Service: General;   Laterality: N/A;   VENTRAL HERNIA REPAIR N/A 04/10/2017   Procedure: HERNIA REPAIR VENTRAL ADULT;  Surgeon: Erroll Luna, MD;  Location: [place];  Service: General;  Laterality: N/A;    FAMILY HISTORY: The patient family history includes Heart attack in his brother, brother, brother, brother, and father; Hyperlipidemia in his mother; Hypertension in his mother.  SOCIAL HISTORY:  The patient  reports that he has never smoked. He has never used smokeless tobacco. He reports current alcohol use. He reports that he does not use drugs.  REVIEW OF SYSTEMS: Review of Systems  Constitutional: Negative for chills and fever.  HENT:  Negative for hoarse voice and nosebleeds.   Eyes:  Negative for discharge, double vision and pain.  Cardiovascular:  Negative for chest pain, claudication, dyspnea on exertion, leg swelling, near-syncope, orthopnea, palpitations, paroxysmal nocturnal dyspnea and syncope.  Respiratory:  Negative for hemoptysis and shortness of breath.   Musculoskeletal:  Negative for muscle cramps and myalgias.  Gastrointestinal:  Negative for abdominal pain, constipation, diarrhea, hematemesis, hematochezia, melena, nausea and vomiting.  Neurological:  Negative for dizziness and light-headedness.   PHYSICAL EXAM: Vitals with BMI 06/26/2021 06/26/2021 04/10/2017  Height - '5\' 11"'  -  Weight - 240 lbs -  BMI - 36.14 -  Systolic [phone]  Diastolic 761 950 86  Pulse 78 76 82    CONSTITUTIONAL: Well-developed and well-nourished. No acute distress.  SKIN: Skin is warm and dry. No rash noted. No cyanosis. No pallor. No jaundice HEAD: Normocephalic and atraumatic.  EYES: No scleral icterus MOUTH/THROAT: Moist oral membranes.  NECK: No JVD present. No thyromegaly noted. No carotid bruits  LYMPHATIC: No visible cervical adenopathy.  CHEST Normal respiratory effort. No intercostal retractions  LUNGS: Clear to auscultation bilaterally.  No stridor. No wheezes. No  rales.  CARDIOVASCULAR: Regular rate and rhythm, positive S1-S2, no murmurs rubs or gallops appreciated. ABDOMINAL: Obese, soft, nontender, nondistended, positive bowel sounds all 4 quadrants. No apparent ascites.  EXTREMITIES: No peripheral edema, warm to touch, +2 bilateral DP and PT pulses HEMATOLOGIC: No significant bruising NEUROLOGIC: Oriented to person, place, and time. Nonfocal. Normal muscle tone.  PSYCHIATRIC: Normal mood and affect. Normal behavior. Cooperative  CARDIAC DATABASE: EKG: 06/26/2021: Normal sinus rhythm, 81 bpm, without underlying ischemia or injury pattern.  Echocardiogram: No results found for this or any previous visit from the past 1095 days.    Stress Testing: No results found for this or any previous visit from the past 1095 days.   Heart Catheterization: None  LABORATORY DATA: CBC Latest Ref Rng & Units 03/05/2017  WBC 4.0 - 10.5 K/uL 6.1  Hemoglobin 13.0 - 17.0 g/dL 12.6(L)  Hematocrit 39.0 - 52.0 % 37.1(L)  Platelets 150 - 400 K/uL 238    CMP Latest Ref Rng & Units 04/05/2017 03/05/2017  Glucose 65 - 99 mg/dL 97 117(H)  BUN 6 - 20 mg/dL 17 18  Creatinine 0.61 - 1.24 mg/dL 1.07 1.02  Sodium  135 - 145 mmol/L 138 137  Potassium 3.5 - 5.1 mmol/L 4.0 3.5  Chloride 101 - 111 mmol/L 105 102  CO2 22 - 32 mmol/L 26 27  Calcium 8.9 - 10.3 mg/dL 9.6 9.3  Total Protein 6.5 - 8.1 g/dL - 7.4  Total Bilirubin 0.3 - 1.2 mg/dL - 0.8  Alkaline Phos 38 - 126 U/L - 33(L)  AST 15 - 41 U/L - 32  ALT 17 - 63 U/L - 30    Lipid Panel  No results found for: CHOL, TRIG, HDL, CHOLHDL, VLDL, LDLCALC, LDLDIRECT, LABVLDL  No components found for: NTPROBNP No results for input(s): PROBNP in the last 8760 hours. No results for input(s): TSH in the last 8760 hours.  BMP No results for input(s): NA, K, CL, CO2, GLUCOSE, BUN, CREATININE, CALCIUM, GFRNONAA, GFRAA in the last 8760 hours.  HEMOGLOBIN A1C No results found for: HGBA1C, MPG  External Labs: Collected:  03/10/2021 Total cholesterol 97, HDL 61, triglycerides 89, LDL 117, non-HDL 136. BUN 19, creatinine 0.99. eGFR 93. Sodium 140, potassium 4.2, chloride 103, bicarb 28. AST 37, ALT 39, alkaline phosphatase 40 Hemoglobin 14.7 g/dL.  Hematocrit 44.8% TSH 2.15 A1c 5.5   IMPRESSION:    ICD-10-CM   1. Family history of premature CAD  Z82.49 EKG 12-Lead    PCV ECHOCARDIOGRAM COMPLETE    PCV CARDIAC STRESS TEST    CT CARDIAC SCORING ([facility] LOCATIONS ONLY)    Lipid Panel With LDL/HDL Ratio    Lipoprotein A (LPA)    Apo A1 + B + Ratio    LDL cholesterol, direct    CMP14+EGFR    2. Mixed hyperlipidemia  E78.2 CT CARDIAC SCORING ([facility] LOCATIONS ONLY)    Lipid Panel With LDL/HDL Ratio    Lipoprotein A (LPA)    Apo A1 + B + Ratio    LDL cholesterol, direct    CMP14+EGFR    3. Benign hypertension  I10 PCV ECHOCARDIOGRAM COMPLETE    PCV CARDIAC STRESS TEST    4. OSA on CPAP  G47.33    Z99.89     5. Class 1 obesity due to excess calories without serious comorbidity with body mass index (BMI) of 33.0 to 33.9 in adult  E66.09    Z68.33        RECOMMENDATIONS: Abie Cheek is a 50 y.o. African-American male whose past medical history and cardiac risk factors include: Hypertension, hyperlipidemia, erectile dysfunction, sleep apnea, family history of premature CAD.  Family history of premature CAD Coronary calcium score for risk stratification Echocardiogram will be ordered to evaluate for structural heart disease and left ventricular systolic function. Exercise treadmill stress test to evaluate for functional status and exercise-induced ischemia Fasting lipid profile prior to the next visit.  Mixed hyperlipidemia Currently on statin therapy. We will recheck a fasting lipid profile prior to next office visit.  Benign hypertension Office blood pressures are not well controlled. Patient informed me that he has not taken his morning medications. Educated on importance of low-salt  diet. Patient is asked to keep a log of his blood pressures and to review it with either his primary care provider or myself at the next visit.  If his systolic blood pressures are consistently greater than 140 mmHg he is asked to call the office for further evaluation. Currently managed by primary care provider.  OSA on CPAP Emphasized the importance of CPAP compliance.  Class 1 obesity due to excess calories without serious comorbidity with body mass index (BMI) of  33.0 to 33.9 in adult Body mass index is 33.47 kg/m. I reviewed with the patient the importance of diet, regular physical activity/exercise, weight loss.   Patient is educated on increasing physical activity gradually as tolerated.  With the goal of moderate intensity exercise for 30 minutes a day 5 days a week.  FINAL MEDICATION LIST END OF ENCOUNTER: No orders of the defined types were placed in this encounter.   Medications Discontinued During This Encounter  Medication Reason   HYDROcodone-acetaminophen (NORCO/VICODIN) 8-337 MG tablet Duplicate   meloxicam (MOBIC) 15 MG tablet    ondansetron (ZOFRAN ODT) 8 MG disintegrating tablet      Current Outpatient Medications:    amLODipine (NORVASC) 10 MG tablet, Take 1 tablet by mouth daily., Disp: , Rfl: 1   atorvastatin (LIPITOR) 20 MG tablet, Take 1 tablet by mouth daily., Disp: , Rfl: 0   gabapentin (NEURONTIN) 300 MG capsule, Take 1 capsule by mouth daily., Disp: , Rfl: 1   HYDROcodone-acetaminophen (NORCO/VICODIN) 5-325 MG tablet, Take 1-2 tablets by mouth every 6 (six) hours as needed., Disp: 15 tablet, Rfl: 0   ibuprofen (ADVIL,MOTRIN) 800 MG tablet, Take 1 tablet (800 mg total) by mouth every 8 (eight) hours as needed., Disp: 30 tablet, Rfl: 0   losartan-hydrochlorothiazide (HYZAAR) 100-25 MG tablet, Take 1 tablet by mouth daily., Disp: , Rfl: 1   Multiple Vitamin (MULTIVITAMIN) tablet, Take 1 tablet by mouth daily., Disp: , Rfl:    Omega-3 Fatty Acids (FISH OIL PO),  Take by mouth., Disp: , Rfl:    omeprazole (PRILOSEC) 20 MG capsule, Take 20 mg by mouth daily., Disp: , Rfl:    sildenafil (VIAGRA) 50 MG tablet, Take 50 mg by mouth daily as needed for erectile dysfunction., Disp: , Rfl:   Orders Placed This Encounter  Procedures   CT CARDIAC SCORING ([facility] LOCATIONS ONLY)   Lipid Panel With LDL/HDL Ratio   Lipoprotein A (LPA)   Apo A1 + B + Ratio   LDL cholesterol, direct   CMP14+EGFR   PCV CARDIAC STRESS TEST   EKG 12-Lead   PCV ECHOCARDIOGRAM COMPLETE    There are no Patient Instructions on file for this visit.   --Continue cardiac medications as reconciled in final medication list. --Return in about 5 weeks (around 07/31/2021) for Follow up Family history of CAD and , Review test results. Or sooner if needed. --Continue follow-up with your primary care physician regarding the management of your other chronic comorbid conditions.  Patient's questions and concerns were addressed to his satisfaction. He voices understanding of the instructions provided during this encounter.   This note was created using a voice recognition software as a result there may be grammatical errors inadvertently enclosed that do not reflect the nature of this encounter. Every attempt is made to correct such errors.  Rex Kras, [place], [facility]  Pager: [phone] Office: [phone]

## 2021-07-03 ENCOUNTER — Other Ambulatory Visit: Payer: Self-pay

## 2021-07-03 ENCOUNTER — Ambulatory Visit: Payer: BC Managed Care – PPO

## 2021-07-03 DIAGNOSIS — I10 Essential (primary) hypertension: Secondary | ICD-10-CM

## 2021-07-03 DIAGNOSIS — Z8249 Family history of ischemic heart disease and other diseases of the circulatory system: Secondary | ICD-10-CM

## 2021-07-18 NOTE — Progress Notes (Signed)
Called patient, NA, no VM-box to leave a message. Called mobile number on file, disconnected.

## 2021-07-19 NOTE — Progress Notes (Signed)
Called and spoke to pt, pt voiced understanding.

## 2021-07-20 ENCOUNTER — Other Ambulatory Visit: Payer: BC Managed Care – PPO

## 2021-07-21 ENCOUNTER — Other Ambulatory Visit: Payer: Self-pay

## 2021-07-21 ENCOUNTER — Ambulatory Visit: Payer: BC Managed Care – PPO

## 2021-07-21 DIAGNOSIS — Z8249 Family history of ischemic heart disease and other diseases of the circulatory system: Secondary | ICD-10-CM | POA: Diagnosis not present

## 2021-07-21 DIAGNOSIS — I10 Essential (primary) hypertension: Secondary | ICD-10-CM

## 2021-07-24 NOTE — Progress Notes (Signed)
Called pt to inform him about his stress results. Pt understood

## 2021-07-24 NOTE — Progress Notes (Signed)
Called and spoke with patient regarding his stress test results.

## 2021-08-09 ENCOUNTER — Ambulatory Visit: Payer: BC Managed Care – PPO | Admitting: Cardiology

## 2021-08-11 ENCOUNTER — Other Ambulatory Visit: Payer: Self-pay

## 2021-08-11 ENCOUNTER — Ambulatory Visit
Admission: RE | Admit: 2021-08-11 | Discharge: 2021-08-11 | Disposition: A | Discharge status: Home / Self Care | Payer: No Typology Code available for payment source | Source: Ambulatory Visit | Attending: Cardiology | Admitting: Cardiology

## 2021-08-11 DIAGNOSIS — Z8249 Family history of ischemic heart disease and other diseases of the circulatory system: Secondary | ICD-10-CM | POA: Diagnosis not present

## 2021-08-11 DIAGNOSIS — E782 Mixed hyperlipidemia: Secondary | ICD-10-CM

## 2021-08-11 DIAGNOSIS — E785 Hyperlipidemia, unspecified: Secondary | ICD-10-CM | POA: Diagnosis not present

## 2021-08-21 NOTE — Progress Notes (Signed)
Called pt to inform him about his CT score. Pt understood

## 2021-08-24 ENCOUNTER — Encounter: Payer: Self-pay | Admitting: Cardiology

## 2021-08-24 ENCOUNTER — Inpatient Hospital Stay: Payer: BC Managed Care – PPO

## 2021-08-24 ENCOUNTER — Ambulatory Visit: Payer: BC Managed Care – PPO | Admitting: Cardiology

## 2021-08-24 ENCOUNTER — Other Ambulatory Visit: Payer: Self-pay

## 2021-08-24 VITALS — BP 134/88 | HR 69 | Temp 98.6°F | Resp 16 | Ht 71.0 in | Wt 240.0 lb

## 2021-08-24 DIAGNOSIS — E6609 Other obesity due to excess calories: Secondary | ICD-10-CM

## 2021-08-24 DIAGNOSIS — I340 Nonrheumatic mitral (valve) insufficiency: Secondary | ICD-10-CM | POA: Diagnosis not present

## 2021-08-24 DIAGNOSIS — Z8249 Family history of ischemic heart disease and other diseases of the circulatory system: Secondary | ICD-10-CM

## 2021-08-24 DIAGNOSIS — Z9989 Dependence on other enabling machines and devices: Secondary | ICD-10-CM

## 2021-08-24 DIAGNOSIS — I493 Ventricular premature depolarization: Secondary | ICD-10-CM | POA: Diagnosis not present

## 2021-08-24 DIAGNOSIS — E782 Mixed hyperlipidemia: Secondary | ICD-10-CM

## 2021-08-24 DIAGNOSIS — R931 Abnormal findings on diagnostic imaging of heart and coronary circulation: Secondary | ICD-10-CM

## 2021-08-24 DIAGNOSIS — G4733 Obstructive sleep apnea (adult) (pediatric): Secondary | ICD-10-CM

## 2021-08-24 DIAGNOSIS — Z6833 Body mass index (BMI) 33.0-33.9, adult: Secondary | ICD-10-CM

## 2021-08-24 DIAGNOSIS — I10 Essential (primary) hypertension: Secondary | ICD-10-CM

## 2021-08-24 MED ORDER — ASPIRIN EC 81 MG PO TBEC: 81.0000 mg | DELAYED_RELEASE_TABLET | Freq: Every day | ORAL | 11 refills | Status: AC

## 2021-08-24 NOTE — Progress Notes (Signed)
Date:  08/24/2021   ID:  Phillip Fletcher, DOB [birth year], MRN [id number]  PCP:  Everardo Beals, NP  Cardiologist:  Rex Kras, DO, [facility] (established care 06/26/2021)  Date: 08/24/21 Last Office Visit: 06/26/2021  Chief Complaint  Patient presents with   Family Hx of CAD   Results   Follow-up    5 weeks    HPI  Phillip Fletcher is a 51 y.o. African-American male who presents to the office with a chief complaint of " follow-up for family history of premature CAD, mitral regurgitation, discuss test results." Patient's past medical history and cardiovascular risk factors include: Hypertension, hyperlipidemia, erectile dysfunction, sleep apnea, family history of premature CAD.  Patient was referred to the office back in December 2022 for evaluation of cardiovascular disease given his family history of premature CAD.   Given his family history of CAD, valvular heart disease, and multiple cardiovascular risk factors that shared decision was to proceed with a coronary calcium score, echocardiogram, and GXT.  Patient is noted to have mild coronary artery calcification placing him at the 92nd percentile.  He is already on Lipitor 20 mg p.o. nightly.  Echocardiogram notes preserved LVEF, normal diastolic function, and moderate to severe mitral regurgitation with left atrial dilatation and possible small left to right PFO.  GXT noted excellent functional capacity for age but PVCs during rest stress and recovery.  Clinically patient is asymptomatic.  He currently works in a [employer].  Overall has good functional capacity for age, denies anginal discomfort or heart failure symptoms.  FUNCTIONAL STATUS: Runs at least 2 miles a day in addition to working out additional 30 to 60 minutes 6 days a week.  ALLERGIES: No Known Allergies  MEDICATION LIST PRIOR TO VISIT: Current Meds  Medication Sig   amLODipine (NORVASC) 10 MG tablet Take 1 tablet by mouth daily.   aspirin EC 81 MG tablet  Take 1 tablet (81 mg total) by mouth daily. Swallow whole.   atorvastatin (LIPITOR) 20 MG tablet Take 1 tablet by mouth daily.   gabapentin (NEURONTIN) 300 MG capsule Take 1 capsule by mouth daily.   ibuprofen (ADVIL,MOTRIN) 800 MG tablet Take 1 tablet (800 mg total) by mouth every 8 (eight) hours as needed.   losartan-hydrochlorothiazide (HYZAAR) 100-25 MG tablet Take 1 tablet by mouth daily.   Multiple Vitamin (MULTIVITAMIN) tablet Take 1 tablet by mouth daily.   Omega-3 Fatty Acids (FISH OIL PO) Take by mouth.   omeprazole (PRILOSEC) 20 MG capsule Take 20 mg by mouth daily.   sildenafil (VIAGRA) 100 MG tablet Take 1 tablet by mouth as needed for erectile dysfunction.     PAST MEDICAL HISTORY: Past Medical History:  Diagnosis Date   Family history of premature CAD    GERD (gastroesophageal reflux disease)    HNP (herniated nucleus pulposus), cervical    Hyperlipidemia    Hypertension    states under control with meds., has been on med. x 20 yr.   Lipoma of flank 03/2017   left   OSA on CPAP    Umbilical hernia 70/1779   Ventral hernia 03/2017    PAST SURGICAL HISTORY: Past Surgical History:  Procedure Laterality Date   INSERTION OF MESH N/A 04/10/2017   Procedure: INSERTION OF MESH;  Surgeon: Erroll Luna, MD;  Location: [place];  Service: General;  Laterality: N/A;   LIPOMA EXCISION Left 04/10/2017   Procedure: EXCISION LIPOMA;  Surgeon: Erroll Luna, MD;  Location: [place];  Service:  General;  Laterality: Left;   NO PAST SURGERIES     UMBILICAL HERNIA REPAIR N/A 04/10/2017   Procedure: HERNIA REPAIR UMBILICAL ADULT;  Surgeon: Erroll Luna, MD;  Location: [place];  Service: General;  Laterality: N/A;   VENTRAL HERNIA REPAIR N/A 04/10/2017   Procedure: HERNIA REPAIR VENTRAL ADULT;  Surgeon: Erroll Luna, MD;  Location: [place];  Service: General;  Laterality: N/A;    FAMILY HISTORY: The patient  family history includes Heart attack in his brother, brother, brother, brother, and father; Hyperlipidemia in his mother; Hypertension in his mother.  Father at the age of 80 had a myocardial infarction and passed away. Brother at the age of 52 had a myocardial infarction and passed away. Brother at the age of 4 had a myocardial infarction and passed away. Brother at the age of 105 had his first CVA and passed away at the age of 87. Brother at the age of 36 currently alive but has had myocardial infarction, 2 PCI's at least, and AICD (per patient). Sister at the age of 24 has had valve replacement, currently alive.  SOCIAL HISTORY:  The patient  reports that he has never smoked. He has never used smokeless tobacco. He reports current alcohol use. He reports that he does not use drugs.  REVIEW OF SYSTEMS: Review of Systems  Constitutional: Negative for chills and fever.  HENT:  Negative for hoarse voice and nosebleeds.   Eyes:  Negative for discharge, double vision and pain.  Cardiovascular:  Negative for chest pain, claudication, dyspnea on exertion, leg swelling, near-syncope, orthopnea, palpitations, paroxysmal nocturnal dyspnea and syncope.  Respiratory:  Negative for hemoptysis and shortness of breath.   Musculoskeletal:  Negative for muscle cramps and myalgias.  Gastrointestinal:  Negative for abdominal pain, constipation, diarrhea, hematemesis, hematochezia, melena, nausea and vomiting.  Neurological:  Negative for dizziness and light-headedness.   PHYSICAL EXAM: Vitals with BMI 08/24/2021 06/26/2021 06/26/2021  Height '5\' 11"'  - '5\' 11"'   Weight 240 lbs - 240 lbs  BMI 12.75 - 17.00  Systolic 174 944 967  Diastolic 88 591 638  Pulse 69 78 76    CONSTITUTIONAL: Well-developed and well-nourished. No acute distress.  SKIN: Skin is warm and dry. No rash noted. No cyanosis. No pallor. No jaundice HEAD: Normocephalic and atraumatic.  EYES: No scleral icterus MOUTH/THROAT: Moist oral  membranes.  NECK: No JVD present. No thyromegaly noted. No carotid bruits  LYMPHATIC: No visible cervical adenopathy.  CHEST Normal respiratory effort. No intercostal retractions  LUNGS: Clear to auscultation bilaterally.  No stridor. No wheezes. No rales.  CARDIOVASCULAR: Regular rate and rhythm, positive G6-K5, soft holosystolic murmur heard at the apex, no rubs or gallops appreciated. ABDOMINAL: Obese, soft, nontender, nondistended, positive bowel sounds all 4 quadrants. No apparent ascites.  EXTREMITIES: No peripheral edema, warm to touch, +2 bilateral DP and PT pulses HEMATOLOGIC: No significant bruising NEUROLOGIC: Oriented to person, place, and time. Nonfocal. Normal muscle tone.  PSYCHIATRIC: Normal mood and affect. Normal behavior. Cooperative  CARDIAC DATABASE: EKG: 06/26/2021: Normal sinus rhythm, 81 bpm, without underlying ischemia or injury pattern.  Echocardiogram: 07/03/2021:  Normal LV systolic function with EF 67%. Left ventricle cavity is normal in size. Normal left ventricular wall thickness. Normal global wall motion. Normal diastolic filling pattern. Calculated EF 67%. Left atrial cavity is moderately dilated at 4.5 cm. A small patent foramen ovale with a left-to-right shunt cannot be excluded. Native mitral valve. Mild mitral valve leaflet thickening. Mild mitral valve prolapse  anterior > posterior leaflets. Moderate to severe mitral regurgitation. There are two eccenteric anteriorly directed and posteriorly directed jets. 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   Stress Testing: Exercise treadmill stress test 07/18/2021: Exercise treadmill stress test performed using Bruce protocol. Patient reached 13.5 METS, and 88% of age predicted maximum heart rate. Exercise capacity was excellent. No chest pain reported. Normal heart rate and hemodynamic response. Stress EKG revealed no ischemic changes. Occasional PVC's seen during rest, stress, and recovery.  Low  risk study.   Heart Catheterization: None  CT Cardiac Scoring: 08/11/2021 Total CAC 69.3 AU, 92nd percentile for patient's age, sex, and race.  LABORATORY DATA: CBC Latest Ref Rng & Units 03/05/2017  WBC 4.0 - 10.5 K/uL 6.1  Hemoglobin 13.0 - 17.0 g/dL 12.6(L)  Hematocrit 39.0 - 52.0 % 37.1(L)  Platelets 150 - 400 K/uL 238    CMP Latest Ref Rng & Units 04/05/2017 03/05/2017  Glucose 65 - 99 mg/dL 97 117(H)  BUN 6 - 20 mg/dL 17 18  Creatinine 0.61 - 1.24 mg/dL 1.07 1.02  Sodium 135 - 145 mmol/L 138 137  Potassium 3.5 - 5.1 mmol/L 4.0 3.5  Chloride 101 - 111 mmol/L 105 102  CO2 22 - 32 mmol/L 26 27  Calcium 8.9 - 10.3 mg/dL 9.6 9.3  Total Protein 6.5 - 8.1 g/dL - 7.4  Total Bilirubin 0.3 - 1.2 mg/dL - 0.8  Alkaline Phos 38 - 126 U/L - 33(L)  AST 15 - 41 U/L - 32  ALT 17 - 63 U/L - 30    Lipid Panel  No results found for: CHOL, TRIG, HDL, CHOLHDL, VLDL, LDLCALC, LDLDIRECT, LABVLDL  No components found for: NTPROBNP No results for input(s): PROBNP in the last 8760 hours. No results for input(s): TSH in the last 8760 hours.  BMP No results for input(s): NA, K, CL, CO2, GLUCOSE, BUN, CREATININE, CALCIUM, GFRNONAA, GFRAA in the last 8760 hours.  HEMOGLOBIN A1C No results found for: HGBA1C, MPG  External Labs: Collected: 03/10/2021 Total cholesterol 97, HDL 61, triglycerides 89, LDL 117, non-HDL 136. BUN 19, creatinine 0.99. eGFR 93. Sodium 140, potassium 4.2, chloride 103, bicarb 28. AST 37, ALT 39, alkaline phosphatase 40 Hemoglobin 14.7 g/dL.  Hematocrit 44.8% TSH 2.15 A1c 5.5   IMPRESSION:    ICD-10-CM   1. Nonrheumatic mitral valve regurgitation  I34.0     2. Premature ventricular contraction  I49.3 LONG TERM MONITOR (3-14 DAYS)    3. Agatston coronary artery calcium score less than 100  R93.1 aspirin EC 81 MG tablet    4. Family history of premature CAD  Z82.49     5. Benign hypertension  I10     6. Mixed hyperlipidemia  E78.2     7. OSA on CPAP   G47.33    Z99.89     8. Class 1 obesity due to excess calories without serious comorbidity with body mass index (BMI) of 33.0 to 33.9 in adult  E66.09    Z68.33        RECOMMENDATIONS: Phillip Fletcher is a 51 y.o. African-American male whose past medical history and cardiac risk factors include: Hypertension, hyperlipidemia, erectile dysfunction, sleep apnea, family history of premature CAD.  Nonrheumatic mitral valve regurgitation Asymptomatic. 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  Subsequently has developed left atrial dilatation and possible PFO. Patient has 3 family members  who has premature CAD who have deceased. Of note, his sister had valve replacement surgery at the age of 64 -patient thinks she had mitral valve replacement. Recommended transesophageal echocardiogram to further evaluate the mitral apparatus and the severity of mitral regurgitation..  After careful review of history and examination, the risks, benefits of transesophageal echocardiogram, and alternatives have been explained to the patient. Complications include but not limited to esophageal perforation (rare), gastrointestinal bleeding (rare), cardiac arrhythmia which can include cardiac arrest and death (rare), pharyngeal irritation / discomfort with swallowing / hematoma, methemoglobinemia, bronchospasm, transient hypoxia, nonsustained ventricular tachycardia, transient atrial fibrillation, minimal hemoptysis, vomiting, hypotension, respiratory compromise, reaction to medications, unavoidable damage to teeth and gums, aspiration pneumonia  were reviewed with the patient.  Patient voices understands, provides verbal feedback, questions answered, and patient wishes to proceed with the procedure.  Premature ventricular contraction Noted to have PVCs at  rest stress and in recovery. Asymptomatic Given his PVCs and mitral valve disease would like to proceed with 3-day extended Holter monitor to evaluate for PVC burden.  Agatston coronary artery calcium score less than 100 Total CAC 69.3AU, 92nd percentile Add aspirin 81 mg p.o. daily. Continue statin therapy. LDL is within acceptable range but still educated on importance of consuming a diet low in cholesterol.  Family history of premature CAD Plan discussed above.  Benign hypertension Office blood pressures are within acceptable range. Home blood pressure also well controlled he checks it every other day and is usually 120/80. Medications reconciled.  Currently managed by primary care provider.  FINAL MEDICATION LIST END OF ENCOUNTER: Meds ordered this encounter  Medications   aspirin EC 81 MG tablet    Sig: Take 1 tablet (81 mg total) by mouth daily. Swallow whole.    Dispense:  30 tablet    Refill:  11     Medications Discontinued During This Encounter  Medication Reason   HYDROcodone-acetaminophen (NORCO/VICODIN) 5-325 MG tablet    sildenafil (VIAGRA) 50 MG tablet      Current Outpatient Medications:    amLODipine (NORVASC) 10 MG tablet, Take 1 tablet by mouth daily., Disp: , Rfl: 1   aspirin EC 81 MG tablet, Take 1 tablet (81 mg total) by mouth daily. Swallow whole., Disp: 30 tablet, Rfl: 11   atorvastatin (LIPITOR) 20 MG tablet, Take 1 tablet by mouth daily., Disp: , Rfl: 0   gabapentin (NEURONTIN) 300 MG capsule, Take 1 capsule by mouth daily., Disp: , Rfl: 1   ibuprofen (ADVIL,MOTRIN) 800 MG tablet, Take 1 tablet (800 mg total) by mouth every 8 (eight) hours as needed., Disp: 30 tablet, Rfl: 0   losartan-hydrochlorothiazide (HYZAAR) 100-25 MG tablet, Take 1 tablet by mouth daily., Disp: , Rfl: 1   Multiple Vitamin (MULTIVITAMIN) tablet, Take 1 tablet by mouth daily., Disp: , Rfl:    Omega-3 Fatty Acids (FISH OIL PO), Take by mouth., Disp: , Rfl:    omeprazole  (PRILOSEC) 20 MG capsule, Take 20 mg by mouth daily., Disp: , Rfl:    sildenafil (VIAGRA) 100 MG tablet, Take 1 tablet by mouth as needed for erectile dysfunction., Disp: , Rfl:   Orders Placed This Encounter  Procedures   LONG TERM MONITOR (3-14 DAYS)    There are no Patient Instructions on file for this visit.   --Continue cardiac medications as reconciled in final medication list. --Return in about 5 weeks (around 09/28/2021) for Follow up after TEE and monitor. . Or sooner if needed. --Continue follow-up with your primary care physician  regarding the management of your other chronic comorbid conditions.  Patient's questions and concerns were addressed to his satisfaction. He voices understanding of the instructions provided during this encounter.   This note was created using a voice recognition software as a result there may be grammatical errors inadvertently enclosed that do not reflect the nature of this encounter. Every attempt is made to correct such errors.  Rex Kras, [place], [facility]  Pager: [phone] Office: [phone]

## 2021-08-24 NOTE — H&P (View-Only) (Signed)
Date:  08/24/2021   ID:  [name], DOB 25-Jun-1971, MRN [id number]  PCP:  Everardo Beals, NP  Cardiologist:  Rex Kras, DO, [facility] (established care 06/26/2021)  Date: 08/24/21 Last Office Visit: 06/26/2021  Chief Complaint  Patient presents with   Family Hx of CAD   Results   Follow-up    5 weeks    HPI  Phillip Fletcher is a 51 y.o. African-American male who presents to the office with a chief complaint of " follow-up for family history of premature CAD, mitral regurgitation, discuss test results." Patient's past medical history and cardiovascular risk factors include: Hypertension, hyperlipidemia, erectile dysfunction, sleep apnea, family history of premature CAD.  Patient was referred to the office back in December 2022 for evaluation of cardiovascular disease given his family history of premature CAD.   Given his family history of CAD, valvular heart disease, and multiple cardiovascular risk factors that shared decision was to proceed with a coronary calcium score, echocardiogram, and GXT.  Patient is noted to have mild coronary artery calcification placing him at the 92nd percentile.  He is already on Lipitor 20 mg p.o. nightly.  Echocardiogram notes preserved LVEF, normal diastolic function, and moderate to severe mitral regurgitation with left atrial dilatation and possible small left to right PFO.  GXT noted excellent functional capacity for age but PVCs during rest stress and recovery.  Clinically patient is asymptomatic.  He currently works in a [employer].  Overall has good functional capacity for age, denies anginal discomfort or heart failure symptoms.  FUNCTIONAL STATUS: Runs at least 2 miles a day in addition to working out additional 30 to 60 minutes 6 days a week.  ALLERGIES: No Known Allergies  MEDICATION LIST PRIOR TO VISIT: Current Meds  Medication Sig   amLODipine (NORVASC) 10 MG tablet Take 1 tablet by mouth daily.   aspirin EC 81 MG tablet  Take 1 tablet (81 mg total) by mouth daily. Swallow whole.   atorvastatin (LIPITOR) 20 MG tablet Take 1 tablet by mouth daily.   gabapentin (NEURONTIN) 300 MG capsule Take 1 capsule by mouth daily.   ibuprofen (ADVIL,MOTRIN) 800 MG tablet Take 1 tablet (800 mg total) by mouth every 8 (eight) hours as needed.   losartan-hydrochlorothiazide (HYZAAR) 100-25 MG tablet Take 1 tablet by mouth daily.   Multiple Vitamin (MULTIVITAMIN) tablet Take 1 tablet by mouth daily.   Omega-3 Fatty Acids (FISH OIL PO) Take by mouth.   omeprazole (PRILOSEC) 20 MG capsule Take 20 mg by mouth daily.   sildenafil (VIAGRA) 100 MG tablet Take 1 tablet by mouth as needed for erectile dysfunction.     PAST MEDICAL HISTORY: Past Medical History:  Diagnosis Date   Family history of premature CAD    GERD (gastroesophageal reflux disease)    HNP (herniated nucleus pulposus), cervical    Hyperlipidemia    Hypertension    states under control with meds., has been on med. x 20 yr.   Lipoma of flank 03/2017   left   OSA on CPAP    Umbilical hernia 09/4915   Ventral hernia 03/2017    PAST SURGICAL HISTORY: Past Surgical History:  Procedure Laterality Date   INSERTION OF MESH N/A 04/10/2017   Procedure: INSERTION OF MESH;  Surgeon: Erroll Luna, MD;  Location: [place];  Service: General;  Laterality: N/A;   LIPOMA EXCISION Left 04/10/2017   Procedure: EXCISION LIPOMA;  Surgeon: Erroll Luna, MD;  Location: [place];  Service:  General;  Laterality: Left;   NO PAST SURGERIES     UMBILICAL HERNIA REPAIR N/A 04/10/2017   Procedure: HERNIA REPAIR UMBILICAL ADULT;  Surgeon: Erroll Luna, MD;  Location: [place];  Service: General;  Laterality: N/A;   VENTRAL HERNIA REPAIR N/A 04/10/2017   Procedure: HERNIA REPAIR VENTRAL ADULT;  Surgeon: Erroll Luna, MD;  Location: [place];  Service: General;  Laterality: N/A;    FAMILY HISTORY: The patient  family history includes Heart attack in his brother, brother, brother, brother, and father; Hyperlipidemia in his mother; Hypertension in his mother.  Father at the age of 85 had a myocardial infarction and passed away. Brother at the age of 56 had a myocardial infarction and passed away. Brother at the age of 58 had a myocardial infarction and passed away. Brother at the age of 52 had his first CVA and passed away at the age of 66. Brother at the age of 61 currently alive but has had myocardial infarction, 2 PCI's at least, and AICD (per patient). Sister at the age of 36 has had valve replacement, currently alive.  SOCIAL HISTORY:  The patient  reports that he has never smoked. He has never used smokeless tobacco. He reports current alcohol use. He reports that he does not use drugs.  REVIEW OF SYSTEMS: Review of Systems  Constitutional: Negative for chills and fever.  HENT:  Negative for hoarse voice and nosebleeds.   Eyes:  Negative for discharge, double vision and pain.  Cardiovascular:  Negative for chest pain, claudication, dyspnea on exertion, leg swelling, near-syncope, orthopnea, palpitations, paroxysmal nocturnal dyspnea and syncope.  Respiratory:  Negative for hemoptysis and shortness of breath.   Musculoskeletal:  Negative for muscle cramps and myalgias.  Gastrointestinal:  Negative for abdominal pain, constipation, diarrhea, hematemesis, hematochezia, melena, nausea and vomiting.  Neurological:  Negative for dizziness and light-headedness.   PHYSICAL EXAM: Vitals with BMI 08/24/2021 06/26/2021 06/26/2021  Height '5\' 11"'  - '5\' 11"'   Weight 240 lbs - 240 lbs  BMI 79.39 - 03.00  Systolic 923 300 762  Diastolic 88 263 335  Pulse 69 78 76    CONSTITUTIONAL: Well-developed and well-nourished. No acute distress.  SKIN: Skin is warm and dry. No rash noted. No cyanosis. No pallor. No jaundice HEAD: Normocephalic and atraumatic.  EYES: No scleral icterus MOUTH/THROAT: Moist oral  membranes.  NECK: No JVD present. No thyromegaly noted. No carotid bruits  LYMPHATIC: No visible cervical adenopathy.  CHEST Normal respiratory effort. No intercostal retractions  LUNGS: Clear to auscultation bilaterally.  No stridor. No wheezes. No rales.  CARDIOVASCULAR: Regular rate and rhythm, positive K5-G2, soft holosystolic murmur heard at the apex, no rubs or gallops appreciated. ABDOMINAL: Obese, soft, nontender, nondistended, positive bowel sounds all 4 quadrants. No apparent ascites.  EXTREMITIES: No peripheral edema, warm to touch, +2 bilateral DP and PT pulses HEMATOLOGIC: No significant bruising NEUROLOGIC: Oriented to person, place, and time. Nonfocal. Normal muscle tone.  PSYCHIATRIC: Normal mood and affect. Normal behavior. Cooperative  CARDIAC DATABASE: EKG: 06/26/2021: Normal sinus rhythm, 81 bpm, without underlying ischemia or injury pattern.  Echocardiogram: 07/03/2021:  Normal LV systolic function with EF 67%. Left ventricle cavity is normal in size. Normal left ventricular wall thickness. Normal global wall motion. Normal diastolic filling pattern. Calculated EF 67%. Left atrial cavity is moderately dilated at 4.5 cm. A small patent foramen ovale with a left-to-right shunt cannot be excluded. Native mitral valve. Mild mitral valve leaflet thickening. Mild mitral valve prolapse  anterior > posterior leaflets. Moderate to severe mitral regurgitation. There are two eccenteric anteriorly directed and posteriorly directed jets. 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   Stress Testing: Exercise treadmill stress test 07/18/2021: Exercise treadmill stress test performed using Bruce protocol. Patient reached 13.5 METS, and 88% of age predicted maximum heart rate. Exercise capacity was excellent. No chest pain reported. Normal heart rate and hemodynamic response. Stress EKG revealed no ischemic changes. Occasional PVC's seen during rest, stress, and recovery.  Low  risk study.   Heart Catheterization: None  CT Cardiac Scoring: 08/11/2021 Total CAC 69.3 AU, 92nd percentile for patient's age, sex, and race.  LABORATORY DATA: CBC Latest Ref Rng & Units 03/05/2017  WBC 4.0 - 10.5 K/uL 6.1  Hemoglobin 13.0 - 17.0 g/dL 12.6(L)  Hematocrit 39.0 - 52.0 % 37.1(L)  Platelets 150 - 400 K/uL 238    CMP Latest Ref Rng & Units 04/05/2017 03/05/2017  Glucose 65 - 99 mg/dL 97 117(H)  BUN 6 - 20 mg/dL 17 18  Creatinine 0.61 - 1.24 mg/dL 1.07 1.02  Sodium 135 - 145 mmol/L 138 137  Potassium 3.5 - 5.1 mmol/L 4.0 3.5  Chloride 101 - 111 mmol/L 105 102  CO2 22 - 32 mmol/L 26 27  Calcium 8.9 - 10.3 mg/dL 9.6 9.3  Total Protein 6.5 - 8.1 g/dL - 7.4  Total Bilirubin 0.3 - 1.2 mg/dL - 0.8  Alkaline Phos 38 - 126 U/L - 33(L)  AST 15 - 41 U/L - 32  ALT 17 - 63 U/L - 30    Lipid Panel  No results found for: CHOL, TRIG, HDL, CHOLHDL, VLDL, LDLCALC, LDLDIRECT, LABVLDL  No components found for: NTPROBNP No results for input(s): PROBNP in the last 8760 hours. No results for input(s): TSH in the last 8760 hours.  BMP No results for input(s): NA, K, CL, CO2, GLUCOSE, BUN, CREATININE, CALCIUM, GFRNONAA, GFRAA in the last 8760 hours.  HEMOGLOBIN A1C No results found for: HGBA1C, MPG  External Labs: Collected: 03/10/2021 Total cholesterol 97, HDL 61, triglycerides 89, LDL 117, non-HDL 136. BUN 19, creatinine 0.99. eGFR 93. Sodium 140, potassium 4.2, chloride 103, bicarb 28. AST 37, ALT 39, alkaline phosphatase 40 Hemoglobin 14.7 g/dL.  Hematocrit 44.8% TSH 2.15 A1c 5.5   IMPRESSION:    ICD-10-CM   1. Nonrheumatic mitral valve regurgitation  I34.0     2. Premature ventricular contraction  I49.3 LONG TERM MONITOR (3-14 DAYS)    3. Agatston coronary artery calcium score less than 100  R93.1 aspirin EC 81 MG tablet    4. Family history of premature CAD  Z82.49     5. Benign hypertension  I10     6. Mixed hyperlipidemia  E78.2     7. OSA on CPAP   G47.33    Z99.89     8. Class 1 obesity due to excess calories without serious comorbidity with body mass index (BMI) of 33.0 to 33.9 in adult  E66.09    Z68.33        RECOMMENDATIONS: Kailyn Dubie is a 51 y.o. African-American male whose past medical history and cardiac risk factors include: Hypertension, hyperlipidemia, erectile dysfunction, sleep apnea, family history of premature CAD.  Nonrheumatic mitral valve regurgitation Asymptomatic. 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  Subsequently has developed left atrial dilatation and possible PFO. Patient has 3 family members  who has premature CAD who have deceased. Of note, his sister had valve replacement surgery at the age of 98 -patient thinks she had mitral valve replacement. Recommended transesophageal echocardiogram to further evaluate the mitral apparatus and the severity of mitral regurgitation..  After careful review of history and examination, the risks, benefits of transesophageal echocardiogram, and alternatives have been explained to the patient. Complications include but not limited to esophageal perforation (rare), gastrointestinal bleeding (rare), cardiac arrhythmia which can include cardiac arrest and death (rare), pharyngeal irritation / discomfort with swallowing / hematoma, methemoglobinemia, bronchospasm, transient hypoxia, nonsustained ventricular tachycardia, transient atrial fibrillation, minimal hemoptysis, vomiting, hypotension, respiratory compromise, reaction to medications, unavoidable damage to teeth and gums, aspiration pneumonia  were reviewed with the patient.  Patient voices understands, provides verbal feedback, questions answered, and patient wishes to proceed with the procedure.  Premature ventricular contraction Noted to have PVCs at  rest stress and in recovery. Asymptomatic Given his PVCs and mitral valve disease would like to proceed with 3-day extended Holter monitor to evaluate for PVC burden.  Agatston coronary artery calcium score less than 100 Total CAC 69.3AU, 92nd percentile Add aspirin 81 mg p.o. daily. Continue statin therapy. LDL is within acceptable range but still educated on importance of consuming a diet low in cholesterol.  Family history of premature CAD Plan discussed above.  Benign hypertension Office blood pressures are within acceptable range. Home blood pressure also well controlled he checks it every other day and is usually 120/80. Medications reconciled.  Currently managed by primary care provider.  FINAL MEDICATION LIST END OF ENCOUNTER: Meds ordered this encounter  Medications   aspirin EC 81 MG tablet    Sig: Take 1 tablet (81 mg total) by mouth daily. Swallow whole.    Dispense:  30 tablet    Refill:  11     Medications Discontinued During This Encounter  Medication Reason   HYDROcodone-acetaminophen (NORCO/VICODIN) 5-325 MG tablet    sildenafil (VIAGRA) 50 MG tablet      Current Outpatient Medications:    amLODipine (NORVASC) 10 MG tablet, Take 1 tablet by mouth daily., Disp: , Rfl: 1   aspirin EC 81 MG tablet, Take 1 tablet (81 mg total) by mouth daily. Swallow whole., Disp: 30 tablet, Rfl: 11   atorvastatin (LIPITOR) 20 MG tablet, Take 1 tablet by mouth daily., Disp: , Rfl: 0   gabapentin (NEURONTIN) 300 MG capsule, Take 1 capsule by mouth daily., Disp: , Rfl: 1   ibuprofen (ADVIL,MOTRIN) 800 MG tablet, Take 1 tablet (800 mg total) by mouth every 8 (eight) hours as needed., Disp: 30 tablet, Rfl: 0   losartan-hydrochlorothiazide (HYZAAR) 100-25 MG tablet, Take 1 tablet by mouth daily., Disp: , Rfl: 1   Multiple Vitamin (MULTIVITAMIN) tablet, Take 1 tablet by mouth daily., Disp: , Rfl:    Omega-3 Fatty Acids (FISH OIL PO), Take by mouth., Disp: , Rfl:    omeprazole  (PRILOSEC) 20 MG capsule, Take 20 mg by mouth daily., Disp: , Rfl:    sildenafil (VIAGRA) 100 MG tablet, Take 1 tablet by mouth as needed for erectile dysfunction., Disp: , Rfl:   Orders Placed This Encounter  Procedures   LONG TERM MONITOR (3-14 DAYS)    There are no Patient Instructions on file for this visit.   --Continue cardiac medications as reconciled in final medication list. --Return in about 5 weeks (around 09/28/2021) for Follow up after TEE and monitor. . Or sooner if needed. --Continue follow-up with your primary care physician  regarding the management of your other chronic comorbid conditions.  Patient's questions and concerns were addressed to his satisfaction. He voices understanding of the instructions provided during this encounter.   This note was created using a voice recognition software as a result there may be grammatical errors inadvertently enclosed that do not reflect the nature of this encounter. Every attempt is made to correct such errors.  Rex Kras, [place], [facility]  Pager: [phone] Office: [phone]

## 2021-09-05 ENCOUNTER — Encounter (HOSPITAL_COMMUNITY): Payer: Self-pay | Admitting: Cardiology

## 2021-09-05 DIAGNOSIS — I493 Ventricular premature depolarization: Secondary | ICD-10-CM | POA: Diagnosis not present

## 2021-09-06 DIAGNOSIS — I340 Nonrheumatic mitral (valve) insufficiency: Secondary | ICD-10-CM | POA: Diagnosis not present

## 2021-09-07 LAB — CBC
Hematocrit: 41.9 % (ref 37.5–51.0)
Hemoglobin: 14.1 g/dL (ref 13.0–17.7)
MCH: 28 pg (ref 26.6–33.0)
MCHC: 33.7 g/dL (ref 31.5–35.7)
MCV: 83 fL (ref 79–97)
Platelets: 242 10*3/uL (ref 150–450)
RBC: 5.03 x10E6/uL (ref 4.14–5.80)
RDW: 14.2 % (ref 11.6–15.4)
WBC: 5.2 10*3/uL (ref 3.4–10.8)

## 2021-09-07 LAB — BASIC METABOLIC PANEL
BUN/Creatinine Ratio: 16 (ref 9–20)
BUN: 21 mg/dL (ref 6–24)
CO2: 25 mmol/L (ref 20–29)
Calcium: 10.1 mg/dL (ref 8.7–10.2)
Chloride: 102 mmol/L (ref 96–106)
Creatinine, Ser: 1.31 mg/dL — ABNORMAL HIGH (ref 0.76–1.27)
Glucose: 103 mg/dL — ABNORMAL HIGH (ref 70–99)
Potassium: 3.8 mmol/L (ref 3.5–5.2)
Sodium: 142 mmol/L (ref 134–144)
eGFR: 66 mL/min/{1.73_m2} (ref >59)

## 2021-09-07 LAB — MAGNESIUM: Magnesium: 2.1 mg/dL (ref 1.6–2.3)

## 2021-09-08 NOTE — Progress Notes (Signed)
Patient is aware of results.

## 2021-09-13 ENCOUNTER — Other Ambulatory Visit: Payer: Self-pay

## 2021-09-13 ENCOUNTER — Ambulatory Visit (HOSPITAL_COMMUNITY)
Admission: RE | Admit: 2021-09-13 | Discharge: 2021-09-13 | Disposition: A | Discharge status: Home / Self Care | Payer: BC Managed Care – PPO | Source: Ambulatory Visit | Attending: Cardiology | Admitting: Cardiology

## 2021-09-13 ENCOUNTER — Ambulatory Visit (HOSPITAL_COMMUNITY)
Admission: RE | Admit: 2021-09-13 | Discharge: 2021-09-13 | Disposition: A | Discharge status: Home / Self Care | Payer: BC Managed Care – PPO | Attending: Cardiology | Admitting: Cardiology

## 2021-09-13 ENCOUNTER — Encounter (HOSPITAL_COMMUNITY): Payer: Self-pay | Admitting: Cardiology

## 2021-09-13 ENCOUNTER — Encounter (HOSPITAL_COMMUNITY)
Admission: RE | Disposition: A | Discharge status: Home / Self Care | Payer: Self-pay | Source: Home / Self Care | Attending: Cardiology

## 2021-09-13 ENCOUNTER — Ambulatory Visit (HOSPITAL_COMMUNITY): Payer: BC Managed Care – PPO | Admitting: Anesthesiology

## 2021-09-13 DIAGNOSIS — I10 Essential (primary) hypertension: Secondary | ICD-10-CM | POA: Diagnosis not present

## 2021-09-13 DIAGNOSIS — E6609 Other obesity due to excess calories: Secondary | ICD-10-CM | POA: Insufficient documentation

## 2021-09-13 DIAGNOSIS — Z8249 Family history of ischemic heart disease and other diseases of the circulatory system: Secondary | ICD-10-CM | POA: Insufficient documentation

## 2021-09-13 DIAGNOSIS — I340 Nonrheumatic mitral (valve) insufficiency: Secondary | ICD-10-CM | POA: Diagnosis not present

## 2021-09-13 DIAGNOSIS — Z9989 Dependence on other enabling machines and devices: Secondary | ICD-10-CM | POA: Insufficient documentation

## 2021-09-13 DIAGNOSIS — Z7982 Long term (current) use of aspirin: Secondary | ICD-10-CM | POA: Diagnosis not present

## 2021-09-13 DIAGNOSIS — I081 Rheumatic disorders of both mitral and tricuspid valves: Secondary | ICD-10-CM | POA: Diagnosis not present

## 2021-09-13 DIAGNOSIS — Z6833 Body mass index (BMI) 33.0-33.9, adult: Secondary | ICD-10-CM | POA: Diagnosis not present

## 2021-09-13 DIAGNOSIS — R059 Cough, unspecified: Secondary | ICD-10-CM | POA: Diagnosis not present

## 2021-09-13 DIAGNOSIS — E782 Mixed hyperlipidemia: Secondary | ICD-10-CM | POA: Diagnosis not present

## 2021-09-13 DIAGNOSIS — N529 Male erectile dysfunction, unspecified: Secondary | ICD-10-CM | POA: Diagnosis not present

## 2021-09-13 DIAGNOSIS — G4733 Obstructive sleep apnea (adult) (pediatric): Secondary | ICD-10-CM | POA: Diagnosis not present

## 2021-09-13 DIAGNOSIS — I493 Ventricular premature depolarization: Secondary | ICD-10-CM | POA: Insufficient documentation

## 2021-09-13 HISTORY — PX: TEE WITHOUT CARDIOVERSION: SHX5443

## 2021-09-13 LAB — ECHO TEE
Area-P 1/2: 2.59 cm2
MV M vel: 6.42 m/s
MV Peak grad: 164.9 mmHg
S' Lateral: 3.6 cm

## 2021-09-13 SURGERY — ECHOCARDIOGRAM, TRANSESOPHAGEAL
Anesthesia: Monitor Anesthesia Care

## 2021-09-13 MED ORDER — PROPOFOL 10 MG/ML IV BOLUS
INTRAVENOUS | Status: DC | PRN
  Administered 2021-09-13 (×3): 50 mg via INTRAVENOUS

## 2021-09-13 MED ORDER — LIDOCAINE 2% (20 MG/ML) 5 ML SYRINGE
INTRAMUSCULAR | Status: DC | PRN
Start: 2021-09-13 — End: 2021-09-13
  Administered 2021-09-13: 100 mg via INTRAVENOUS

## 2021-09-13 MED ORDER — PROPOFOL 500 MG/50ML IV EMUL
INTRAVENOUS | Status: DC | PRN
  Administered 2021-09-13: 150 ug/kg/min via INTRAVENOUS

## 2021-09-13 MED ORDER — SODIUM CHLORIDE 0.9 % IV SOLN: INTRAVENOUS | Status: DC | PRN

## 2021-09-13 MED ORDER — SODIUM CHLORIDE 0.9 % IV SOLN: INTRAVENOUS | Status: DC

## 2021-09-13 NOTE — CV Procedure (Addendum)
Transesophageal echocardiogram (TEE) : Preliminary report ?09/13/21 ? ?TEE was performed without complications ?  ?LV: Normal EF. ?RV: Normal structure and function.  ?LA: Grossly normal.  Spontaneous echo contrast was absent present.  No thrombus present. ?Left atrial appendage: Spontaneous echo contrast was not present.  No thrombus present. ?Inter atrial septum is intact without defect. Color doppler negative for atrial level shunting. ?RA: Grossly normal.  ?  ?MV: moderate regurgitation (multiple jets), no stenosis, no vegetation noted.  ?TV: no regurgitation, no stenosis, no vegetation noted. ?AV: no regurgitation, no stenosis, no vegetation noted.   ?PV: no regurgitation, no stenosis, no vegetation noted. ?  ?Thoracic and ascending aorta: Mild plaque. ?  ?Images of TEE were sub optimal due to excessive coughing. ? ?Final report forthcoming. ? ?Rex Kras, DO, FACC ? ?Pager: [phone] ?Office: [phone] ? ?Addendum:  ? ?Called his wife Theadora Rama and had to leave a voicemail.  ? ?Rex Kras, DO, FACC ? ?Pager: [phone] ?Office: [phone] ? ?

## 2021-09-13 NOTE — Discharge Instructions (Signed)

## 2021-09-13 NOTE — Anesthesia Postprocedure Evaluation (Signed)
Anesthesia Post Note ? ?Patient: Phillip Fletcher ? ?Procedure(s) Performed: TRANSESOPHAGEAL ECHOCARDIOGRAM (TEE) ? ?  ? ?Patient location during evaluation: PACU ?Anesthesia Type: MAC ?Level of consciousness: awake and alert ?Pain management: pain level controlled ?Vital Signs Assessment: post-procedure vital signs reviewed and stable ?Respiratory status: spontaneous breathing and respiratory function stable ?Cardiovascular status: stable ?Postop Assessment: no apparent nausea or vomiting ?Anesthetic complications: no ? ? ?No notable events documented. ? ?Last Vitals:  ?Vitals:  ? 09/13/21 1500 09/13/21 1507  ?BP: 139/82 (!) 154/91  ?Pulse: 73 69  ?Resp: 17 (!) 21  ?Temp:    ?SpO2: 95% 97%  ?  ?Last Pain:  ?Vitals:  ? 09/13/21 1452  ?TempSrc: Temporal  ?PainSc:   ? ? ?  ?  ?  ?  ?  ?  ? ?Merlinda Frederick ? ? ? ? ?

## 2021-09-13 NOTE — Transfer of Care (Signed)
Immediate Anesthesia Transfer of Care Note ? ?Patient: Phillip Fletcher ? ?Procedure(s) Performed: TRANSESOPHAGEAL ECHOCARDIOGRAM (TEE) ? ?Patient Location: PACU ? ?Anesthesia Type:MAC ? ?Level of Consciousness: drowsy and patient cooperative ? ?Airway & Oxygen Therapy: Patient Spontanous Breathing and Patient connected to nasal cannula oxygen ? ?Post-op Assessment: Report given to RN and Post -op Vital signs reviewed and stable ? ?Post vital signs: Reviewed and stable ? ?Last Vitals:  ?Vitals Value Taken Time  ?BP 129/92 09/13/21 1454  ?Temp 36.3 ?C 09/13/21 1452  ?Pulse 72 09/13/21 1457  ?Resp 21 09/13/21 1457  ?SpO2 94 % 09/13/21 1457  ?Vitals shown include unvalidated device data. ? ?Last Pain:  ?Vitals:  ? 09/13/21 1452  ?TempSrc: Temporal  ?PainSc:   ?   ? ?  ? ?Complications: No notable events documented. ?

## 2021-09-13 NOTE — Progress Notes (Signed)
?  Echocardiogram ?Echocardiogram Transesophageal has been performed. ? ?Phillip Fletcher ?09/13/2021, 3:07 PM ?

## 2021-09-13 NOTE — Interval H&P Note (Signed)
History and Physical Interval Note: ? ?09/13/2021 ?1:37 PM ? ?Phillip Fletcher  has presented today for surgery, with the diagnosis of MITRAL REGURGITATION.  The various methods of treatment have been discussed with the patient and family. After consideration of risks, benefits and other options for treatment, the patient has consented to  Procedure(s): ?TRANSESOPHAGEAL ECHOCARDIOGRAM (TEE) (N/A) as a surgical intervention.  The patient's history has been reviewed, patient examined, no change in status, stable for surgery.  I have reviewed the patient's chart and labs.  Questions were answered to the patient's satisfaction.   ? ? ?Darcel Zick [place], DO, FACC ? ?Pager: [phone] ?Office: [phone] ? ? ? ?

## 2021-09-13 NOTE — Anesthesia Preprocedure Evaluation (Addendum)
Anesthesia Evaluation  ?Patient identified by MRN, date of birth, ID band ?Patient awake ? ? ? ?Reviewed: ?Allergy & Precautions, NPO status , Patient's Chart, lab work & pertinent test results ? ?Airway ?Mallampati: III ? ?TM Distance: >3 FB ?Neck ROM: Full ? ? ? Dental ?no notable dental hx. ? ?  ?Pulmonary ?sleep apnea and Continuous Positive Airway Pressure Ventilation ,  ?  ?Pulmonary exam normal ?breath sounds clear to auscultation ? ? ? ? ? ? Cardiovascular ?hypertension, Pt. on medications ? ?Rhythm:Regular Rate:Normal ? ? ?  ?Neuro/Psych ?negative neurological ROS ? negative psych ROS  ? GI/Hepatic ?Neg liver ROS, GERD  ,  ?Endo/Other  ?negative endocrine ROS ? Renal/GU ?negative Renal ROS  ?negative genitourinary ?  ?Musculoskeletal ?negative musculoskeletal ROS ?(+)  ? Abdominal ?  ?Peds ?negative pediatric ROS ?(+)  Hematology ?negative hematology ROS ?(+)   ?Anesthesia Other Findings ? ? Reproductive/Obstetrics ?negative OB ROS ? ?  ? ? ? ? ? ? ? ? ? ? ? ? ? ?  ?  ? ? ? ? ? ? ? ?Anesthesia Physical ?Anesthesia Plan ? ?ASA: 2 ? ?Anesthesia Plan: MAC  ? ?Post-op Pain Management: Minimal or no pain anticipated  ? ?Induction: Intravenous ? ?PONV Risk Score and Plan: Propofol infusion, TIVA and Treatment may vary due to age or medical condition ? ?Airway Management Planned: Natural Airway, Nasal Cannula and Simple Face Mask ? ?Additional Equipment: None ? ?Intra-op Plan:  ? ?Post-operative Plan:  ? ?Informed Consent: I have reviewed the patients History and Physical, chart, labs and discussed the procedure including the risks, benefits and alternatives for the proposed anesthesia with the patient or authorized representative who has indicated his/her understanding and acceptance.  ? ? ? ?Dental advisory given ? ?Plan Discussed with: Anesthesiologist, CRNA and Surgeon ? ?Anesthesia Plan Comments:   ? ? ? ? ? ?Anesthesia Quick Evaluation ? ?

## 2021-09-28 ENCOUNTER — Ambulatory Visit: Payer: BC Managed Care – PPO | Admitting: Cardiology

## 2021-09-28 DIAGNOSIS — I493 Ventricular premature depolarization: Secondary | ICD-10-CM | POA: Diagnosis not present

## 2021-10-01 ENCOUNTER — Other Ambulatory Visit: Payer: Self-pay | Admitting: Cardiology

## 2021-10-01 DIAGNOSIS — I493 Ventricular premature depolarization: Secondary | ICD-10-CM

## 2021-10-01 DIAGNOSIS — I340 Nonrheumatic mitral (valve) insufficiency: Secondary | ICD-10-CM

## 2021-10-01 MED ORDER — DILTIAZEM HCL ER COATED BEADS 180 MG PO CP24: 180.0000 mg | ORAL_CAPSULE | Freq: Every morning | ORAL | 0 refills | Status: DC

## 2021-10-03 ENCOUNTER — Other Ambulatory Visit: Payer: Self-pay | Admitting: Cardiology

## 2021-10-03 DIAGNOSIS — I340 Nonrheumatic mitral (valve) insufficiency: Secondary | ICD-10-CM

## 2021-10-03 DIAGNOSIS — I493 Ventricular premature depolarization: Secondary | ICD-10-CM

## 2021-10-09 ENCOUNTER — Other Ambulatory Visit: Payer: Self-pay

## 2021-10-09 ENCOUNTER — Encounter: Payer: Self-pay | Admitting: Cardiology

## 2021-10-09 ENCOUNTER — Ambulatory Visit: Payer: BC Managed Care – PPO | Admitting: Cardiology

## 2021-10-09 VITALS — BP 137/90 | HR 75 | Temp 97.3°F | Resp 16 | Ht 71.0 in | Wt 241.0 lb

## 2021-10-09 DIAGNOSIS — Z8249 Family history of ischemic heart disease and other diseases of the circulatory system: Secondary | ICD-10-CM | POA: Diagnosis not present

## 2021-10-09 DIAGNOSIS — I493 Ventricular premature depolarization: Secondary | ICD-10-CM

## 2021-10-09 DIAGNOSIS — G4733 Obstructive sleep apnea (adult) (pediatric): Secondary | ICD-10-CM

## 2021-10-09 DIAGNOSIS — I340 Nonrheumatic mitral (valve) insufficiency: Secondary | ICD-10-CM | POA: Diagnosis not present

## 2021-10-09 DIAGNOSIS — R931 Abnormal findings on diagnostic imaging of heart and coronary circulation: Secondary | ICD-10-CM | POA: Diagnosis not present

## 2021-10-09 DIAGNOSIS — I10 Essential (primary) hypertension: Secondary | ICD-10-CM

## 2021-10-09 DIAGNOSIS — E782 Mixed hyperlipidemia: Secondary | ICD-10-CM

## 2021-10-09 MED ORDER — DILTIAZEM HCL ER COATED BEADS 180 MG PO CP24: 180.0000 mg | ORAL_CAPSULE | Freq: Every day | ORAL | 0 refills | Status: DC

## 2021-10-09 NOTE — Progress Notes (Signed)
? ?Date:  10/09/2021  ? ?ID:  Phillip Fletcher, DOB 04/22/1971, MRN [id number] ? ?PCP:  Everardo Beals, NP  ?Cardiologist:  Rex Kras, DO, [facility] (established care 06/26/2021) ? ?Date: 10/09/21 ?Last Office Visit: 08/24/2021 ? ?Chief Complaint  ?Patient presents with  ? Results  ? Follow-up  ? ? ?HPI  ?Phillip Fletcher is a 51 y.o. African-American male whose past medical history and cardiovascular risk factors include: Mitral regurgitation, PFO, premature ventricular contractions, hypertension, hyperlipidemia, erectile dysfunction, sleep apnea, family history of premature CAD. ? ?Patient was referred to the office back in December 2022 for evaluation of cardiovascular disease given his family history of premature CAD. Given his family history of CAD, valvular heart disease, and multiple cardiovascular risk factors that shared decision was to proceed with a coronary calcium score, echocardiogram, and GXT. ? ?He is noted to have mild coronary calcification but still placing him at the 92nd percentile.  GXT noted excellent functional capacity for age but had noted PVCs at rest, stress, and within recovery.  Patient was advised to have a 3-day extended Holter monitor to evaluate for PVC burden.  He was noted to have a total PVC burden of approximately 10%.  After that he was started on diltiazem 180 mg p.o. daily which has improved his palpitations in general. ? ?Given his mild coronary calcification he is currently on aspirin and statin therapy and taking red yeast rice.  GXT was also discussed above and echocardiogram was performed was noted preserved LVEF, normal diastolic function with moderate to severe MR with left atrial dilatation and small left to right PFO.  He was recommended to undergo transesophageal echocardiogram to further evaluate the mitral valve as he has had family members who have required valve interventions in the past. ? ?Results of the transesophageal echocardiogram reviewed with him in  great detail including the images of today's office visit. ? ?Patient's wife Phillip Fletcher was present during today's encounter via telecommunications on speaker phone.  She also provided collateral history and actively participated in the visit. ? ?FUNCTIONAL STATUS: ?Runs at least 2 miles a day in addition to working out additional 30 to 60 minutes 6 days a week. ? ?ALLERGIES: ?No Known Allergies ? ?MEDICATION LIST PRIOR TO VISIT: ?Current Meds  ?Medication Sig  ? aspirin EC 81 MG tablet Take 1 tablet (81 mg total) by mouth daily. Swallow whole.  ? atorvastatin (LIPITOR) 20 MG tablet Take 1 tablet by mouth daily.  ? ibuprofen (ADVIL,MOTRIN) 800 MG tablet Take 1 tablet (800 mg total) by mouth every 8 (eight) hours as needed.  ? losartan-hydrochlorothiazide (HYZAAR) 100-25 MG tablet Take 1 tablet by mouth daily.  ? Multiple Vitamin (MULTIVITAMIN) tablet Take 1 tablet by mouth daily.  ? Omega-3 Fatty Acids (FISH OIL PO) Take by mouth.  ? omeprazole (PRILOSEC) 20 MG capsule Take 20 mg by mouth daily.  ? sildenafil (VIAGRA) 100 MG tablet Take 1 tablet by mouth as needed for erectile dysfunction.  ? [DISCONTINUED] diltiazem (CARDIZEM CD) 180 MG 24 hr capsule TAKE 1 CAPSULE(180 MG) BY MOUTH EVERY MORNING  ?  ? ?PAST MEDICAL HISTORY: ?Past Medical History:  ?Diagnosis Date  ? Coronary artery calcification   ? Family history of premature CAD   ? GERD (gastroesophageal reflux disease)   ? HNP (herniated nucleus pulposus), cervical   ? Hyperlipidemia   ? Hypertension   ? states under control with meds., has been on med. x 20 yr.  ? Lipoma of flank 03/2017  ? left  ?  Mitral regurgitation   ? OSA on CPAP   ? Premature ventricular contractions   ? Umbilical hernia 80/9983  ? Ventral hernia 03/2017  ? ? ?PAST SURGICAL HISTORY: ?Past Surgical History:  ?Procedure Laterality Date  ? INSERTION OF MESH N/A 04/10/2017  ? Procedure: INSERTION OF MESH;  Surgeon: Erroll Luna, MD;  Location: [place];  Service: General;   Laterality: N/A;  ? LIPOMA EXCISION Left 04/10/2017  ? Procedure: EXCISION LIPOMA;  Surgeon: Erroll Luna, MD;  Location: [place];  Service: General;  Laterality: Left;  ? NO PAST SURGERIES    ? TEE WITHOUT CARDIOVERSION N/A 09/13/2021  ? Procedure: TRANSESOPHAGEAL ECHOCARDIOGRAM (TEE);  Surgeon: Rex Kras, DO;  Location: [place] [facility];  Service: Cardiovascular;  Laterality: N/A;  ? UMBILICAL HERNIA REPAIR N/A 04/10/2017  ? Procedure: HERNIA REPAIR UMBILICAL ADULT;  Surgeon: Erroll Luna, MD;  Location: [place];  Service: General;  Laterality: N/A;  ? VENTRAL HERNIA REPAIR N/A 04/10/2017  ? Procedure: HERNIA REPAIR VENTRAL ADULT;  Surgeon: Erroll Luna, MD;  Location: [place];  Service: General;  Laterality: N/A;  ? ? ?FAMILY HISTORY: ?The patient family history includes Heart attack in his brother, brother, brother, brother, and father; Hyperlipidemia in his mother; Hypertension in his mother. ? ?Father at the age of 33 had a myocardial infarction and passed away. ?Brother at the age of 69 had a myocardial infarction and passed away. ?Brother at the age of 49 had a myocardial infarction and passed away. ?Brother at the age of 69 had his first CVA and passed away at the age of 60. ?Brother at the age of 68 currently alive but has had myocardial infarction, 2 PCI's at least, and AICD (per patient). ?Sister at the age of 37 has had valve replacement, currently alive. ? ?SOCIAL HISTORY:  ?The patient  reports that he has never smoked. He has never used smokeless tobacco. He reports current alcohol use. He reports that he does not use drugs. ? ?REVIEW OF SYSTEMS: ?Review of Systems  ?Constitutional: Negative for chills and fever.  ?HENT:  Negative for hoarse voice and nosebleeds.   ?Eyes:  Negative for discharge, double vision and pain.  ?Cardiovascular:  Negative for chest pain, claudication, dyspnea on exertion, leg swelling, near-syncope, orthopnea,  palpitations, paroxysmal nocturnal dyspnea and syncope.  ?Respiratory:  Negative for hemoptysis and shortness of breath.   ?Musculoskeletal:  Negative for muscle cramps and myalgias.  ?Gastrointestinal:  Negative for abdominal pain, constipation, diarrhea, hematemesis, hematochezia, melena, nausea and vomiting.  ?Neurological:  Negative for dizziness and light-headedness.  ? ?PHYSICAL EXAM: ? ?  10/09/2021  ?  2:58 PM 09/13/2021  ?  3:07 PM 09/13/2021  ?  3:00 PM  ?Vitals with BMI  ?Height '5\' 11"'$     ?Weight 241 lbs    ?BMI 33.63    ?Systolic 382 505 397  ?Diastolic 90 91 82  ?Pulse 75 69 73  ? ? ?CONSTITUTIONAL: Well-developed and well-nourished. No acute distress.  ?SKIN: Skin is warm and dry. No rash noted. No cyanosis. No pallor. No jaundice ?HEAD: Normocephalic and atraumatic.  ?EYES: No scleral icterus ?MOUTH/THROAT: Moist oral membranes.  ?NECK: No JVD present. No thyromegaly noted. No carotid bruits  ?LYMPHATIC: No visible cervical adenopathy.  ?CHEST Normal respiratory effort. No intercostal retractions  ?LUNGS: Clear to auscultation bilaterally.  No stridor. No wheezes. No rales.  ?CARDIOVASCULAR: Regular rate and rhythm, positive Q7-H4, soft holosystolic murmur heard at the apex, no rubs or  gallops appreciated. ?ABDOMINAL: Obese, soft, nontender, nondistended, positive bowel sounds all 4 quadrants. No apparent ascites.  ?EXTREMITIES: No peripheral edema, warm to touch, +2 bilateral DP and PT pulses ?HEMATOLOGIC: No significant bruising ?NEUROLOGIC: Oriented to person, place, and time. Nonfocal. Normal muscle tone.  ?PSYCHIATRIC: Normal mood and affect. Normal behavior. Cooperative ? ?CARDIAC DATABASE: ?EKG: ?06/26/2021: Normal sinus rhythm, 81 bpm, without underlying ischemia or injury pattern. ? ?Echocardiogram: ?07/03/2021:  ?Normal LV systolic function with EF 67%. Left ventricle cavity is normal in size. Normal left ventricular wall thickness. Normal global wall motion. Normal diastolic filling pattern.  Calculated EF 67%. ?Left atrial cavity is moderately dilated at 4.5 cm. A small patent foramen ovale with a left-to-right shunt cannot be excluded. ?Native mitral valve. Mild mitral valve leaflet thickening. Mild mitr

## 2021-12-31 ENCOUNTER — Other Ambulatory Visit: Payer: Self-pay | Admitting: Cardiology

## 2021-12-31 DIAGNOSIS — I493 Ventricular premature depolarization: Secondary | ICD-10-CM

## 2021-12-31 DIAGNOSIS — I340 Nonrheumatic mitral (valve) insufficiency: Secondary | ICD-10-CM

## 2022-03-26 ENCOUNTER — Ambulatory Visit: Payer: BC Managed Care – PPO | Admitting: Cardiology

## 2022-03-27 ENCOUNTER — Ambulatory Visit: Payer: BC Managed Care – PPO | Admitting: Cardiology

## 2022-03-27 ENCOUNTER — Encounter: Payer: Self-pay | Admitting: Cardiology

## 2022-03-27 VITALS — BP 146/85 | HR 76 | Temp 98.6°F | Resp 16 | Ht 71.0 in | Wt 234.4 lb

## 2022-03-27 DIAGNOSIS — I340 Nonrheumatic mitral (valve) insufficiency: Secondary | ICD-10-CM | POA: Diagnosis not present

## 2022-03-27 DIAGNOSIS — E782 Mixed hyperlipidemia: Secondary | ICD-10-CM

## 2022-03-27 DIAGNOSIS — I493 Ventricular premature depolarization: Secondary | ICD-10-CM | POA: Diagnosis not present

## 2022-03-27 DIAGNOSIS — I10 Essential (primary) hypertension: Secondary | ICD-10-CM | POA: Diagnosis not present

## 2022-03-27 DIAGNOSIS — R931 Abnormal findings on diagnostic imaging of heart and coronary circulation: Secondary | ICD-10-CM | POA: Diagnosis not present

## 2022-03-27 DIAGNOSIS — G4733 Obstructive sleep apnea (adult) (pediatric): Secondary | ICD-10-CM

## 2022-03-27 DIAGNOSIS — Z8249 Family history of ischemic heart disease and other diseases of the circulatory system: Secondary | ICD-10-CM

## 2022-03-27 NOTE — Progress Notes (Signed)
Date:  03/27/2022   ID:  [name], DOB 1970-09-10, MRN [id number]  PCP:  Everardo Beals, NP  Cardiologist:  Rex Kras, DO, [facility] (established care 06/26/2021)  Date: 03/27/22 Last Office Visit: 10/09/2021  Chief Complaint  Patient presents with   PVC   Family history of premature CAD    HPI  Phillip Fletcher is a 51 y.o. African-American male whose past medical history and cardiovascular risk factors include: Mitral regurgitation, PFO, premature ventricular contractions, hypertension, hyperlipidemia, erectile dysfunction, sleep apnea, family history of premature CAD.  Referred to the office back in December 2022 for evaluation of cardiovascular disease given his family history of premature CAD.  Coronary calcium score noted a total CAC 69.3 AU placing him at the 92nd percentile and GXT noted excellent functional capacity for age and overall low risk study.  The exercise stress test noted PVCs at rest, stress, and within recovery.  He underwent a cardiac monitor and was noted to have a PVC burden of approximately 10%.  He was started on diltiazem which he tolerated well and his palpitations essentially resolved.  Work-up also included an echocardiogram which noted moderate to severe MR and left to right PFO on transthoracic echocardiogram.  Given the family history of mitral valve disease he did undergo transesophageal echocardiogram and was noted to have at least moderate mitral regurgitation.  Shared decision was to follow him clinically.  He now presents for 69-monthfollow-up visit.  Home blood pressures range between 120-130 mmHg.  He denies anginal discomfort or heart failure symptoms.  Patient states that after initiating diltiazem his extra beats/palpitations have improved significantly.  He denies near-syncope or syncopal events, orthopnea, PND, lower extremity swelling.  FUNCTIONAL STATUS: Runs at least 2 miles a day in addition to working out additional 30 to 60  minutes 6 days a week.  ALLERGIES: No Known Allergies  MEDICATION LIST PRIOR TO VISIT: Current Meds  Medication Sig   aspirin EC 81 MG tablet Take 1 tablet (81 mg total) by mouth daily. Swallow whole.   atorvastatin (LIPITOR) 20 MG tablet Take 1 tablet by mouth daily.   diltiazem (CARDIZEM CD) 180 MG 24 hr capsule TAKE 1 CAPSULE(180 MG) BY MOUTH EVERY MORNING   ibuprofen (ADVIL,MOTRIN) 800 MG tablet Take 1 tablet (800 mg total) by mouth every 8 (eight) hours as needed.   losartan-hydrochlorothiazide (HYZAAR) 100-25 MG tablet Take 1 tablet by mouth daily.   Multiple Vitamin (MULTIVITAMIN) tablet Take 1 tablet by mouth daily.   Omega-3 Fatty Acids (FISH OIL PO) Take by mouth.   omeprazole (PRILOSEC) 20 MG capsule Take 20 mg by mouth daily.   sildenafil (VIAGRA) 100 MG tablet Take 1 tablet by mouth as needed for erectile dysfunction.     PAST MEDICAL HISTORY: Past Medical History:  Diagnosis Date   Coronary artery calcification    Family history of premature CAD    GERD (gastroesophageal reflux disease)    HNP (herniated nucleus pulposus), cervical    Hyperlipidemia    Hypertension    states under control with meds., has been on med. x 20 yr.   Lipoma of flank 03/2017   left   Mitral regurgitation    OSA on CPAP    Premature ventricular contractions    Umbilical hernia 033/3832  Ventral hernia 03/2017    PAST SURGICAL HISTORY: Past Surgical History:  Procedure Laterality Date   INSERTION OF MESH N/A 04/10/2017   Procedure: INSERTION OF MESH;  Surgeon: CErroll Luna MD;  Location: [place];  Service: General;  Laterality: N/A;   LIPOMA EXCISION Left 04/10/2017   Procedure: EXCISION LIPOMA;  Surgeon: Erroll Luna, MD;  Location: [place];  Service: General;  Laterality: Left;   NO PAST SURGERIES     TEE WITHOUT CARDIOVERSION N/A 09/13/2021   Procedure: TRANSESOPHAGEAL ECHOCARDIOGRAM (TEE);  Surgeon: Rex Kras, DO;  Location: [place]  [facility];  Service: Cardiovascular;  Laterality: N/A;   UMBILICAL HERNIA REPAIR N/A 04/10/2017   Procedure: HERNIA REPAIR UMBILICAL ADULT;  Surgeon: Erroll Luna, MD;  Location: [place];  Service: General;  Laterality: N/A;   VENTRAL HERNIA REPAIR N/A 04/10/2017   Procedure: HERNIA REPAIR VENTRAL ADULT;  Surgeon: Erroll Luna, MD;  Location: [place];  Service: General;  Laterality: N/A;    FAMILY HISTORY: The patient family history includes Heart attack in his brother, brother, brother, brother, and father; Hyperlipidemia in his mother; Hypertension in his mother.  Father at the age of 48 had a myocardial infarction and passed away. Brother at the age of 46 had a myocardial infarction and passed away. Brother at the age of 68 had a myocardial infarction and passed away. Brother at the age of 61 had his first CVA and passed away at the age of 58. Brother at the age of 71 currently alive but has had myocardial infarction, 2 PCI's at least, and AICD (per patient). Sister at the age of 32 has had valve replacement, currently alive.  SOCIAL HISTORY:  The patient  reports that he has never smoked. He has never used smokeless tobacco. He reports current alcohol use. He reports that he does not use drugs.  REVIEW OF SYSTEMS: Review of Systems  Cardiovascular:  Negative for chest pain, claudication, dyspnea on exertion, irregular heartbeat, leg swelling, near-syncope, orthopnea, palpitations, paroxysmal nocturnal dyspnea and syncope.  Respiratory:  Negative for shortness of breath.   Hematologic/Lymphatic: Negative for bleeding problem.  Musculoskeletal:  Negative for muscle cramps and myalgias.  Neurological:  Negative for dizziness and light-headedness.    PHYSICAL EXAM:    03/27/2022    1:18 PM 10/09/2021    2:58 PM 09/13/2021    3:07 PM  Vitals with BMI  Height '5\' 11"'  '5\' 11"'    Weight 234 lbs 6 oz 241 lbs   BMI 01.09 32.35   Systolic 573 220 254   Diastolic 85 90 91  Pulse 76 75 69    CONSTITUTIONAL: Well-developed and well-nourished. No acute distress.  SKIN: Skin is warm and dry. No rash noted. No cyanosis. No pallor. No jaundice HEAD: Normocephalic and atraumatic.  EYES: No scleral icterus MOUTH/THROAT: Moist oral membranes.  NECK: No JVD present. No thyromegaly noted. No carotid bruits  CHEST Normal respiratory effort. No intercostal retractions  LUNGS: Clear to auscultation bilaterally.  No stridor. No wheezes. No rales.  CARDIOVASCULAR: Regular rate and rhythm, positive Y7-C6, soft holosystolic murmur heard at the apex, no rubs or gallops appreciated. ABDOMINAL: Obese, soft, nontender, nondistended, positive bowel sounds all 4 quadrants. No apparent ascites.  EXTREMITIES: No peripheral edema, warm to touch, +2 bilateral DP and PT pulses HEMATOLOGIC: No significant bruising NEUROLOGIC: Oriented to person, place, and time. Nonfocal. Normal muscle tone.  PSYCHIATRIC: Normal mood and affect. Normal behavior. Cooperative  CARDIAC DATABASE: EKG: 06/26/2021: Normal sinus rhythm, 81 bpm, without underlying ischemia or injury pattern. 03/27/22 Normal sinus rhythm, 71 bpm, LVH with voltage criteria, without underlying ischemia or injury pattern.  Echocardiogram: 07/03/2021:  Normal LV systolic function  with EF 67%. Left ventricle cavity is normal in size. Normal left ventricular wall thickness. Normal global wall motion. Normal diastolic filling pattern. Calculated EF 67%. Left atrial cavity is moderately dilated at 4.5 cm. A small patent foramen ovale with a left-to-right shunt cannot be excluded. Native mitral valve. Mild mitral valve leaflet thickening. Mild mitral valve prolapse anterior > posterior leaflets. Moderate to severe mitral regurgitation. There are two eccenteric anteriorly directed and posteriorly directed jets. 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  TEE  09/13/2021:  1. Left ventricular  ejection fraction, by estimation, is 60 to 65%. The  left ventricle has normal function. The left ventricle has no regional  wall motion abnormalities. There is mild left ventricular hypertrophy.  Left ventricular diastolic function  could not be evaluated.   2. Right ventricular systolic function is normal. The right ventricular  size is normal.   3. No left atrial/left atrial appendage thrombus was detected. The LAA  emptying velocity was 60 cm/s.   4. Native mitral valve, mild leaflet thickness, mild bowing of anterior  mitral leaflet, multiple jets, majority of regurgitation involves A2 and  P2 scallops. Quantitative measures were difficult to obtain due to  technical difficulties as noted above.  Moderate mitral valve regurgitation. No evidence of mitral stenosis.   5. The aortic valve is tricuspid. Aortic valve regurgitation is not  visualized. No aortic stenosis is present.   6. No atrial level shunt detected by color flow Doppler, limited  evaluation.    Stress Testing: Exercise treadmill stress test 07/18/2021: Exercise treadmill stress test performed using Bruce protocol. Patient reached 13.5 METS, and 88% of age predicted maximum heart rate. Exercise capacity was excellent. No chest pain reported. Normal heart rate and hemodynamic response. Stress EKG revealed no ischemic changes. Occasional PVC's seen during rest, stress, and recovery.  Low risk study.   Heart Catheterization: None  CT Cardiac Scoring: 08/11/2021 Total CAC 69.3 AU, 92nd percentile for patient's age, sex, and race.  3 day extended Holter monitor: August 24, 2021- August 28, 2021 Dominant rhythm normal sinus. Heart rate 29-146 bpm.  Avg HR 81 bpm. Minimum heart rate : 29 bpm on 08/26/2021 at 4:56 AM (sinus bradycardia, second-degree type I AV block with PVCs). No atrial fibrillation, supraventricular tachycardia, high grade AV block, pauses (3 seconds or longer). Total ventricular ectopic burden 10.1%  (predominantly isolated PVCs). Total supraventricular ectopic burden <1%. Patient triggered events: 0.  LABORATORY DATA:    Latest Ref Rng & Units 09/06/2021    4:10 PM 03/05/2017   12:36 AM  CBC  WBC 3.4 - 10.8 x10E3/uL 5.2  6.1   Hemoglobin 13.0 - 17.7 g/dL 14.1  12.6   Hematocrit 37.5 - 51.0 % 41.9  37.1   Platelets 150 - 450 x10E3/uL 242  238        Latest Ref Rng & Units 09/06/2021    4:10 PM 04/05/2017    9:35 AM 03/05/2017   12:36 AM  CMP  Glucose 70 - 99 mg/dL 103  97  117   BUN 6 - 24 mg/dL '21  17  18   ' Creatinine 0.76 - 1.27 mg/dL 1.31  1.07  1.02   Sodium 134 - 144 mmol/L 142  138  137   Potassium 3.5 - 5.2 mmol/L 3.8  4.0  3.5   Chloride 96 - 106 mmol/L 102  105  102   CO2 20 - 29 mmol/L '25  26  27   ' Calcium 8.7 -  10.2 mg/dL 10.1  9.6  9.3   Total Protein 6.5 - 8.1 g/dL   7.4   Total Bilirubin 0.3 - 1.2 mg/dL   0.8   Alkaline Phos 38 - 126 U/L   33   AST 15 - 41 U/L   32   ALT 17 - 63 U/L   30     Lipid Panel  No results found for: "CHOL", "TRIG", "HDL", "CHOLHDL", "VLDL", "LDLCALC", "LDLDIRECT", "LABVLDL"  No components found for: "NTPROBNP" No results for input(s): "PROBNP" in the last 8760 hours. No results for input(s): "TSH" in the last 8760 hours.  BMP Recent Labs    09/06/21 1610  NA 142  K 3.8  CL 102  CO2 25  GLUCOSE 103*  BUN 21  CREATININE 1.31*  CALCIUM 10.1    HEMOGLOBIN A1C No results found for: "HGBA1C", "MPG"  External Labs: Collected: 03/10/2021 Total cholesterol 97, HDL 61, triglycerides 89, LDL 117, non-HDL 136. BUN 19, creatinine 0.99. eGFR 93. Sodium 140, potassium 4.2, chloride 103, bicarb 28. AST 37, ALT 39, alkaline phosphatase 40 Hemoglobin 14.7 g/dL.  Hematocrit 44.8% TSH 2.15 A1c 5.5   IMPRESSION:    ICD-10-CM   1. Premature ventricular contraction  I49.3 PCV ECHOCARDIOGRAM COMPLETE    LONG TERM MONITOR (3-14 DAYS)    2. Agatston coronary artery calcium score less than 100  R93.1     3. Nonrheumatic  mitral valve regurgitation  I34.0 PCV ECHOCARDIOGRAM COMPLETE    4. Benign hypertension  I10 EKG 12-Lead    5. Mixed hyperlipidemia  E78.2     6. Family history of premature CAD  Z82.49     7. OSA on CPAP  G47.33    Z99.89        RECOMMENDATIONS: Phillip Fletcher is a 51 y.o. African-American male whose past medical history and cardiac risk factors include: Mitral regurgitation, PFO, premature ventricular contractions, hypertension, hyperlipidemia, erectile dysfunction, sleep apnea, family history of premature CAD.  Premature ventricular contraction PVC burden approximately 10% as of February 2023. Has responded very well to diltiazem. The plan was to repeat a cardiac monitor to reevaluate PVC burden prior to today's office visit; however, patient states that per insurance coverage he would be better if we do it next year. We will schedule a 7-day extended Holter monitor prior to the next office visit.  Agatston coronary artery calcium score less than 100 Total CAC 69.3 AU, 92nd percentile.  Continue aspirin and statin therapy.  He continues to take ready spice. He has has an upcoming annual well visit with PCP -he is requested to forward a copy of his labs for reference. Has undergone an echocardiogram and GXT in the recent past.  Nonrheumatic mitral valve regurgitation Asymptomatic. Educated on the importance of avoiding isometric contractions/activities while working out. Educated him on the importance of blood pressure management.  Would recommend a goal SBP of 120 mmHg if able to tolerate. We will repeat echocardiogram prior to next visit to reevaluate LVEF, chamber quantification, and severity of MR.  Benign hypertension Patient states that home blood pressures are better controlled. I have asked him to keep a log of his blood pressures and to call us if further medication titration can be considered.  Recommending a goal SBP of 120 mmHg if able to tolerate. Reemphasized  the importance of a low-salt diet.  FINAL MEDICATION LIST END OF ENCOUNTER: No orders of the defined types were placed in this encounter.  There are no discontinued medications.  Current Outpatient Medications:    aspirin EC 81 MG tablet, Take 1 tablet (81 mg total) by mouth daily. Swallow whole., Disp: 30 tablet, Rfl: 11   atorvastatin (LIPITOR) 20 MG tablet, Take 1 tablet by mouth daily., Disp: , Rfl: 0   diltiazem (CARDIZEM CD) 180 MG 24 hr capsule, TAKE 1 CAPSULE(180 MG) BY MOUTH EVERY MORNING, Disp: 90 capsule, Rfl: 0   ibuprofen (ADVIL,MOTRIN) 800 MG tablet, Take 1 tablet (800 mg total) by mouth every 8 (eight) hours as needed., Disp: 30 tablet, Rfl: 0   losartan-hydrochlorothiazide (HYZAAR) 100-25 MG tablet, Take 1 tablet by mouth daily., Disp: , Rfl: 1   Multiple Vitamin (MULTIVITAMIN) tablet, Take 1 tablet by mouth daily., Disp: , Rfl:    Omega-3 Fatty Acids (FISH OIL PO), Take by mouth., Disp: , Rfl:    omeprazole (PRILOSEC) 20 MG capsule, Take 20 mg by mouth daily., Disp: , Rfl:    sildenafil (VIAGRA) 100 MG tablet, Take 1 tablet by mouth as needed for erectile dysfunction., Disp: , Rfl:   Orders Placed This Encounter  Procedures   LONG TERM MONITOR (3-14 DAYS)   EKG 12-Lead   PCV ECHOCARDIOGRAM COMPLETE    There are no Patient Instructions on file for this visit.   --Continue cardiac medications as reconciled in final medication list. --Return in about 6 months (around 10/08/2022) for Follow up PVCs and mitral regurgitation (status post echo and cardiac monitor). Or sooner if needed. --Continue follow-up with your primary care physician regarding the management of your other chronic comorbid conditions.  Patient's questions and concerns were addressed to his satisfaction. He voices understanding of the instructions provided during this encounter.   This note was created using a voice recognition software as a result there may be grammatical errors inadvertently enclosed  that do not reflect the nature of this encounter. Every attempt is made to correct such errors.  Rex Kras, [place], [facility]  Pager: [phone] Office: [phone]

## 2022-04-12 ENCOUNTER — Other Ambulatory Visit: Payer: Self-pay | Admitting: Cardiology

## 2022-04-12 DIAGNOSIS — I340 Nonrheumatic mitral (valve) insufficiency: Secondary | ICD-10-CM

## 2022-04-12 DIAGNOSIS — I493 Ventricular premature depolarization: Secondary | ICD-10-CM

## 2022-07-06 ENCOUNTER — Other Ambulatory Visit: Payer: Self-pay | Admitting: Cardiology

## 2022-07-06 DIAGNOSIS — I340 Nonrheumatic mitral (valve) insufficiency: Secondary | ICD-10-CM

## 2022-07-06 DIAGNOSIS — I493 Ventricular premature depolarization: Secondary | ICD-10-CM

## 2022-09-07 ENCOUNTER — Ambulatory Visit: Payer: BC Managed Care – PPO

## 2022-09-07 ENCOUNTER — Other Ambulatory Visit: Payer: BC Managed Care – PPO

## 2022-09-07 DIAGNOSIS — I493 Ventricular premature depolarization: Secondary | ICD-10-CM

## 2022-10-01 ENCOUNTER — Ambulatory Visit: Payer: BC Managed Care – PPO

## 2022-10-01 DIAGNOSIS — I340 Nonrheumatic mitral (valve) insufficiency: Secondary | ICD-10-CM | POA: Diagnosis not present

## 2022-10-01 DIAGNOSIS — I493 Ventricular premature depolarization: Secondary | ICD-10-CM

## 2022-10-08 ENCOUNTER — Encounter: Payer: Self-pay | Admitting: Cardiology

## 2022-10-08 ENCOUNTER — Ambulatory Visit: Payer: BC Managed Care – PPO | Admitting: Cardiology

## 2022-10-08 ENCOUNTER — Other Ambulatory Visit: Payer: Self-pay | Admitting: Cardiology

## 2022-10-08 VITALS — BP 165/103 | HR 85 | Resp 18 | Ht 71.0 in | Wt 241.6 lb

## 2022-10-08 DIAGNOSIS — E782 Mixed hyperlipidemia: Secondary | ICD-10-CM

## 2022-10-08 DIAGNOSIS — I10 Essential (primary) hypertension: Secondary | ICD-10-CM

## 2022-10-08 DIAGNOSIS — I493 Ventricular premature depolarization: Secondary | ICD-10-CM

## 2022-10-08 DIAGNOSIS — R931 Abnormal findings on diagnostic imaging of heart and coronary circulation: Secondary | ICD-10-CM

## 2022-10-08 DIAGNOSIS — I340 Nonrheumatic mitral (valve) insufficiency: Secondary | ICD-10-CM | POA: Diagnosis not present

## 2022-10-08 DIAGNOSIS — Z8249 Family history of ischemic heart disease and other diseases of the circulatory system: Secondary | ICD-10-CM

## 2022-10-08 DIAGNOSIS — G4733 Obstructive sleep apnea (adult) (pediatric): Secondary | ICD-10-CM

## 2022-10-08 MED ORDER — HYDROCHLOROTHIAZIDE 25 MG PO TABS: 25.0000 mg | ORAL_TABLET | Freq: Every morning | ORAL | 0 refills | Status: DC

## 2022-10-08 MED ORDER — LOSARTAN POTASSIUM 100 MG PO TABS: 100.0000 mg | ORAL_TABLET | Freq: Every day | ORAL | 0 refills | Status: DC

## 2022-10-08 MED ORDER — DILTIAZEM HCL ER COATED BEADS 240 MG PO CP24: 240.0000 mg | ORAL_CAPSULE | Freq: Every morning | ORAL | 0 refills | Status: DC

## 2022-10-08 MED ORDER — ATORVASTATIN CALCIUM 20 MG PO TABS: 20.0000 mg | ORAL_TABLET | Freq: Every day | ORAL | 0 refills | Status: DC

## 2022-10-08 NOTE — Progress Notes (Signed)
Date:  10/08/2022   ID:  Phillip Fletcher, DOB 22-Nov-1970, MRN [id number]  PCP:  Everardo Beals, NP  Cardiologist:  Rex Kras, DO, [facility] (established care 06/26/2021)  Date: 10/08/22 Last Office Visit: 03/27/2022  Chief Complaint  Patient presents with   PVC's   mitral valve reguritation   Follow-up    6 months    HPI  Phillip Fletcher is a 52 y.o. African-American male whose past medical history and cardiovascular risk factors include: Mitral regurgitation, PFO, premature ventricular contractions, hypertension, hyperlipidemia, erectile dysfunction, sleep apnea, family history of premature CAD.  Referred to the office back in December 2022 for evaluation of cardiovascular disease given his family history of premature CAD.  Coronary calcium score noted a total CAC 69.3 AU placing him at the 92nd percentile and GXT noted excellent functional capacity for age and overall low risk study.  The exercise stress test noted PVCs at rest, stress, and within recovery.  He underwent a cardiac monitor and was noted to have a PVC burden of approximately 10%.  He was started on diltiazem which he tolerated well and his palpitations essentially resolved.  In the past, transthoracic echo noted moderate/severe MR and follow up TEE noted atleast moderate MR due to mild bowing of anterior mitral leaflet, multiple jets, majority of regurgitation involves A2 and P2 scallops.   He presents today for 1 year follow-up visit.  His wife is available on speaker phone.  Denies anginal chest pain or heart failure symptoms.  Overall functional capacity remains stable.  Office blood pressures are not well-controlled at home blood pressures are not regularly checked.  Recent echocardiogram notes no significant change in severity of MR and most recent Zio patch notes improvement in PVC burden from 10% to 5.1%.   Patient is compliant with his CPAP for the initial 4 to 5 hours and takes it off in the middle of the  night.  He usually wakes up at 6am but stays in bed.  FUNCTIONAL STATUS: Runs at least 2 miles a day in addition to working out additional 30 to 60 minutes 6 days a week.  ALLERGIES: No Known Allergies  MEDICATION LIST PRIOR TO VISIT: Current Meds  Medication Sig   aspirin EC 81 MG tablet Take 1 tablet (81 mg total) by mouth daily. Swallow whole.   diltiazem (CARDIZEM CD) 240 MG 24 hr capsule Take 1 capsule (240 mg total) by mouth every morning.   hydrochlorothiazide (HYDRODIURIL) 25 MG tablet Take 1 tablet (25 mg total) by mouth every morning.   ibuprofen (ADVIL,MOTRIN) 800 MG tablet Take 1 tablet (800 mg total) by mouth every 8 (eight) hours as needed.   losartan (COZAAR) 100 MG tablet Take 1 tablet (100 mg total) by mouth daily at 10 pm.   Multiple Vitamin (MULTIVITAMIN) tablet Take 1 tablet by mouth daily.   Omega-3 Fatty Acids (FISH OIL PO) Take by mouth.   omeprazole (PRILOSEC) 20 MG capsule Take 20 mg by mouth daily.   sildenafil (VIAGRA) 100 MG tablet Take 1 tablet by mouth as needed for erectile dysfunction.   [DISCONTINUED] atorvastatin (LIPITOR) 20 MG tablet Take 1 tablet by mouth daily.   [DISCONTINUED] diltiazem (CARDIZEM CD) 180 MG 24 hr capsule TAKE 1 CAPSULE(180 MG) BY MOUTH EVERY MORNING   [DISCONTINUED] losartan-hydrochlorothiazide (HYZAAR) 100-25 MG tablet Take 1 tablet by mouth daily.     PAST MEDICAL HISTORY: Past Medical History:  Diagnosis Date   Coronary artery calcification    Family history of  premature CAD    GERD (gastroesophageal reflux disease)    HNP (herniated nucleus pulposus), cervical    Hyperlipidemia    Hypertension    states under control with meds., has been on med. x 20 yr.   Lipoma of flank 03/2017   left   Mitral regurgitation    OSA on CPAP    Premature ventricular contractions    Umbilical hernia 0000000   Ventral hernia 03/2017    PAST SURGICAL HISTORY: Past Surgical History:  Procedure Laterality Date   INSERTION OF MESH N/A  04/10/2017   Procedure: INSERTION OF MESH;  Surgeon: Erroll Luna, MD;  Location: [place];  Service: General;  Laterality: N/A;   LIPOMA EXCISION Left 04/10/2017   Procedure: EXCISION LIPOMA;  Surgeon: Erroll Luna, MD;  Location: [place];  Service: General;  Laterality: Left;   NO PAST SURGERIES     TEE WITHOUT CARDIOVERSION N/A 09/13/2021   Procedure: TRANSESOPHAGEAL ECHOCARDIOGRAM (TEE);  Surgeon: Rex Kras, DO;  Location: [place] [facility];  Service: Cardiovascular;  Laterality: N/A;   UMBILICAL HERNIA REPAIR N/A 04/10/2017   Procedure: HERNIA REPAIR UMBILICAL ADULT;  Surgeon: Erroll Luna, MD;  Location: [place];  Service: General;  Laterality: N/A;   VENTRAL HERNIA REPAIR N/A 04/10/2017   Procedure: HERNIA REPAIR VENTRAL ADULT;  Surgeon: Erroll Luna, MD;  Location: [place];  Service: General;  Laterality: N/A;    FAMILY HISTORY: The patient family history includes Heart attack in his brother, brother, brother, brother, and father; Hyperlipidemia in his mother; Hypertension in his mother.  Father at the age of 18 had a myocardial infarction and passed away. Brother at the age of 105 had a myocardial infarction and passed away. Brother at the age of 41 had a myocardial infarction and passed away. Brother at the age of 66 had his first CVA and passed away at the age of 40. Brother at the age of 35 currently alive but has had myocardial infarction, 2 PCI's at least, and AICD (per patient). Sister at the age of 11 has had valve replacement, currently alive.  SOCIAL HISTORY:  The patient  reports that he has never smoked. He has never used smokeless tobacco. He reports current alcohol use. He reports that he does not use drugs.  REVIEW OF SYSTEMS: Review of Systems  Cardiovascular:  Negative for chest pain, claudication, dyspnea on exertion, irregular heartbeat, leg swelling, near-syncope, orthopnea, palpitations,  paroxysmal nocturnal dyspnea and syncope.  Respiratory:  Negative for shortness of breath.   Hematologic/Lymphatic: Negative for bleeding problem.  Musculoskeletal:  Negative for muscle cramps and myalgias.  Neurological:  Negative for dizziness and light-headedness.    PHYSICAL EXAM:    10/08/2022    2:39 PM 03/27/2022    1:18 PM 10/09/2021    2:58 PM  Vitals with BMI  Height 5\' 11"  5\' 11"  5\' 11"   Weight 241 lbs 10 oz 234 lbs 6 oz 241 lbs  BMI 33.71 [id number] 0000000  Systolic 123XX123 123456 0000000  Diastolic [id number] 85 90  Pulse 85 76 75   Physical Exam  Constitutional: No distress.  Age appropriate, hemodynamically stable.   Neck: No JVD present.  Cardiovascular: Normal rate, regular rhythm, S1 normal, S2 normal, intact distal pulses and normal pulses. Exam reveals no gallop, no S3 and no S4.  Murmur heard. Blowing systolic murmur is present with a grade of 3/6 at the apex. Pulses:      Dorsalis pedis pulses are 2+  on the right side and 2+ on the left side.       Posterior tibial pulses are 2+ on the right side and 2+ on the left side.  Pulmonary/Chest: Effort normal and breath sounds normal. No stridor. He has no wheezes. He has no rales.  Abdominal: Soft. Bowel sounds are normal. He exhibits no distension. There is no abdominal tenderness.  Musculoskeletal:        General: No edema.     Cervical back: Neck supple.  Neurological: He is alert and oriented to person, place, and time. He has intact cranial nerves (2-12).  Skin: Skin is warm and moist.    CARDIAC DATABASE: EKG: 10/08/2022: Sinus rhythm, 74 bpm, LVH per voltage criteria, without underlying ischemia or injury pattern.  Echocardiogram: 07/03/2021: LVEF 67%, moderately dilated left atrium, small PFO cannot be excluded, mild mitral valve prolapse, moderate to severe MR.  TEE  09/13/2021:  1. Left ventricular ejection fraction, by estimation, is 60 to 65%. The  left ventricle has normal function. The left ventricle has no  regional  wall motion abnormalities. There is mild left ventricular hypertrophy.  Left ventricular diastolic function  could not be evaluated.   2. Right ventricular systolic function is normal. The right ventricular  size is normal.   3. No left atrial/left atrial appendage thrombus was detected. The LAA  emptying velocity was 60 cm/s.   4. Native mitral valve, mild leaflet thickness, mild bowing of anterior  mitral leaflet, multiple jets, majority of regurgitation involves A2 and  P2 scallops. Quantitative measures were difficult to obtain due to  technical difficulties as noted above.  Moderate mitral valve regurgitation. No evidence of mitral stenosis.   5. The aortic valve is tricuspid. Aortic valve regurgitation is not  visualized. No aortic stenosis is present.   6. No atrial level shunt detected by color flow Doppler, limited  evaluation.   10/01/2022:  Left ventricle cavity is normal in size. Mild concentric hypertrophy of the left ventricle. Normal global wall motion. Normal LV systolic function with EF 55%. Doppler evidence of grade I (impaired) diastolic dysfunction, normal LAP.  Left atrial cavity is mildly dilated. Aneurysmal interatrial septum without 2D or color Doppler evidence of shunting.  Mild myxomatous degeneration or mitral valve. Mild to moderate, posteriorly directed mitral regurgitation.  No evidence of pulmonary hypertension.  Previous study on 07/03/2021 had reported moderate to severe mitral regurgitation, PFO-not well appreciated on this study.     Stress Testing: Exercise treadmill stress test 07/18/2021: Exercise treadmill stress test performed using Bruce protocol. Patient reached 13.5 METS, and 88% of age predicted maximum heart rate. Exercise capacity was excellent. No chest pain reported. Normal heart rate and hemodynamic response. Stress EKG revealed no ischemic changes. Occasional PVC's seen during rest, stress, and recovery.  Low risk study.   Heart  Catheterization: None  CT Cardiac Scoring: 08/11/2021 Total CAC 69.3 AU, 92nd percentile for patient's age, sex, and race.  3 day extended Holter monitor: August 24, 2021- August 28, 2021 Dominant rhythm normal sinus. Heart rate 29-146 bpm.  Avg HR 81 bpm. Minimum heart rate : 29 bpm on 08/26/2021 at 4:56 AM (sinus bradycardia, second-degree type I AV block with PVCs). No atrial fibrillation, supraventricular tachycardia, high grade AV block, pauses (3 seconds or longer). Total ventricular ectopic burden 10.1% (predominantly isolated PVCs). Total supraventricular ectopic burden <1%. Patient triggered events: 0.  Cardiac monitor (Zio Patch): 09/07/2022 - 09/14/2022 Dominant rhythm sinus. Heart rate 26-150 bpm.  Avg HR 78  bpm. Minimum heart rate 26 bpm occurred on 09/09/2022 and 6:58 PM -underlying rhythm sinus bradycardia with second-degree type I AV block with rare PVC. No atrial fibrillation, supraventricular tachycardia, ventricular tachycardia, high grade AV block, pauses (3 seconds or longer). Total ventricular ectopic burden 5.1%. Total supraventricular ectopic burden <1%. Patient triggered events: 0.   LABORATORY DATA:    Latest Ref Rng & Units 09/06/2021    4:10 PM 03/05/2017   12:36 AM  CBC  WBC 3.4 - 10.8 x10E3/uL 5.2  6.1   Hemoglobin 13.0 - 17.7 g/dL 14.1  12.6   Hematocrit 37.5 - 51.0 % 41.9  37.1   Platelets 150 - 450 x10E3/uL 242  238        Latest Ref Rng & Units 09/06/2021    4:10 PM 04/05/2017    9:35 AM 03/05/2017   12:36 AM  CMP  Glucose 70 - 99 mg/dL 103  97  117   BUN 6 - 24 mg/dL 21  17  18    Creatinine 0.76 - 1.27 mg/dL 1.31  1.07  1.02   Sodium 134 - 144 mmol/L 142  138  137   Potassium 3.5 - 5.2 mmol/L 3.8  4.0  3.5   Chloride 96 - 106 mmol/L 102  105  102   CO2 20 - 29 mmol/L 25  26  27    Calcium 8.7 - 10.2 mg/dL 10.1  9.6  9.3   Total Protein 6.5 - 8.1 g/dL   7.4   Total Bilirubin 0.3 - 1.2 mg/dL   0.8   Alkaline Phos 38 - 126 U/L   33   AST  15 - 41 U/L   32   ALT 17 - 63 U/L   30     Lipid Panel  No results found for: "CHOL", "TRIG", "HDL", "CHOLHDL", "VLDL", "LDLCALC", "LDLDIRECT", "LABVLDL"  No components found for: "NTPROBNP" No results for input(s): "PROBNP" in the last 8760 hours. No results for input(s): "TSH" in the last 8760 hours.  BMP No results for input(s): "NA", "K", "CL", "CO2", "GLUCOSE", "BUN", "CREATININE", "CALCIUM", "GFRNONAA", "GFRAA" in the last 8760 hours.   HEMOGLOBIN A1C No results found for: "HGBA1C", "MPG"  External Labs: Collected: 03/10/2021 Total cholesterol 97, HDL 61, triglycerides 89, LDL 117, non-HDL 136. BUN 19, creatinine 0.99. eGFR 93. Sodium 140, potassium 4.2, chloride 103, bicarb 28. AST 37, ALT 39, alkaline phosphatase 40 Hemoglobin 14.7 g/dL.  Hematocrit 44.8% TSH 2.15 A1c 5.5   IMPRESSION:    ICD-10-CM   1. Premature ventricular contraction  I49.3 EKG 12-Lead    diltiazem (CARDIZEM CD) 240 MG 24 hr capsule    2. Agatston coronary artery calcium score less than 100  R93.1 EKG 12-Lead    atorvastatin (LIPITOR) 20 MG tablet    3. Nonrheumatic mitral valve regurgitation  I34.0     4. Benign hypertension  I10 hydrochlorothiazide (HYDRODIURIL) 25 MG tablet    losartan (COZAAR) 100 MG tablet    diltiazem (CARDIZEM CD) 240 MG 24 hr capsule    5. Mixed hyperlipidemia  E78.2 atorvastatin (LIPITOR) 20 MG tablet    6. Family history of premature CAD  Z82.49     7. OSA on CPAP  G47.33        RECOMMENDATIONS: Phillip Fletcher is a 52 y.o. African-American male whose past medical history and cardiac risk factors include: Mitral regurgitation, PFO, premature ventricular contractions, hypertension, hyperlipidemia, erectile dysfunction, sleep apnea, family history of premature CAD.  Premature ventricular contraction PVC burden  approximately 10% as of February 2023. PVC burden approximately 5.1% as of March 2024 remains asymptomatic clinically Increase diltiazem from 180  mg to 240 mg p.o. daily. Echocardiogram notes preserved LVEF. Monitor for now  Agatston coronary artery calcium score less than 100 Mixed hyperlipidemia Total CAC 69.3 AU, 92nd percentile.  Continue aspirin and statin therapy.   Denies anginal chest pain or heart failure symptoms. 30-day refill provided for atorvastatin. Will have follow-up labs with PCP in the coming weeks when he goes for annual well visit.  Nonrheumatic mitral valve regurgitation Asymptomatic. Severity of MR remains relatively stable. Reemphasized importance of blood pressure management. Educated him on the importance of not lifting heavy objects or prolonged isometric contractions that would lead to Valsalva maneuver.  Benign hypertension Office blood pressures are not well-controlled. Home blood pressures are now monitored. Patient requesting refills on medications. Discontinue losartan/HCTZ and transition to losartan at night, HCTZ in the morning. Anticipate that he will need up titration of antihypertensive medications but I have asked him to keep a log of his blood pressures and to follow-up with either myself or PCP for up titration of medical therapy based on home blood pressure logs. Also encouraged him to use his CPAP regularly and to increase the usage (on the number of hours per night) to decrease  I have encouraged him the importance of annual follow-up visits/yearly physicals with PCP.  He will provide [place] a copy of his labs when available.  I like to see him back in at least 6 months to make sure that the blood pressures have come down nicely and or sooner if needed.  FINAL MEDICATION LIST END OF ENCOUNTER: Meds ordered this encounter  Medications   hydrochlorothiazide (HYDRODIURIL) 25 MG tablet    Sig: Take 1 tablet (25 mg total) by mouth every morning.    Dispense:  90 tablet    Refill:  0   losartan (COZAAR) 100 MG tablet    Sig: Take 1 tablet (100 mg total) by mouth daily at 10 pm.     Dispense:  90 tablet    Refill:  0   diltiazem (CARDIZEM CD) 240 MG 24 hr capsule    Sig: Take 1 capsule (240 mg total) by mouth every morning.    Dispense:  90 capsule    Refill:  0   atorvastatin (LIPITOR) 20 MG tablet    Sig: Take 1 tablet (20 mg total) by mouth at bedtime.    Dispense:  30 tablet    Refill:  0   Medications Discontinued During This Encounter  Medication Reason   losartan-hydrochlorothiazide (HYZAAR) 100-25 MG tablet Dose change   diltiazem (CARDIZEM CD) 180 MG 24 hr capsule Dose change   atorvastatin (LIPITOR) 20 MG tablet Reorder      Current Outpatient Medications:    aspirin EC 81 MG tablet, Take 1 tablet (81 mg total) by mouth daily. Swallow whole., Disp: 30 tablet, Rfl: 11   diltiazem (CARDIZEM CD) 240 MG 24 hr capsule, Take 1 capsule (240 mg total) by mouth every morning., Disp: 90 capsule, Rfl: 0   hydrochlorothiazide (HYDRODIURIL) 25 MG tablet, Take 1 tablet (25 mg total) by mouth every morning., Disp: 90 tablet, Rfl: 0   ibuprofen (ADVIL,MOTRIN) 800 MG tablet, Take 1 tablet (800 mg total) by mouth every 8 (eight) hours as needed., Disp: 30 tablet, Rfl: 0   losartan (COZAAR) 100 MG tablet, Take 1 tablet (100 mg total) by mouth daily at 10 pm., Disp: 90 tablet,  Rfl: 0   Multiple Vitamin (MULTIVITAMIN) tablet, Take 1 tablet by mouth daily., Disp: , Rfl:    Omega-3 Fatty Acids (FISH OIL PO), Take by mouth., Disp: , Rfl:    omeprazole (PRILOSEC) 20 MG capsule, Take 20 mg by mouth daily., Disp: , Rfl:    sildenafil (VIAGRA) 100 MG tablet, Take 1 tablet by mouth as needed for erectile dysfunction., Disp: , Rfl:    atorvastatin (LIPITOR) 20 MG tablet, Take 1 tablet (20 mg total) by mouth at bedtime., Disp: 30 tablet, Rfl: 0  Orders Placed This Encounter  Procedures   EKG 12-Lead    There are no Patient Instructions on file for this visit.   --Continue cardiac medications as reconciled in final medication list. --Return in about 6 months (around 04/10/2023)  for Follow up mitral valve and PVCs. Or sooner if needed. --Continue follow-up with your primary care physician regarding the management of your other chronic comorbid conditions.  Patient's questions and concerns were addressed to his satisfaction. He voices understanding of the instructions provided during this encounter.   This note was created using a voice recognition software as a result there may be grammatical errors inadvertently enclosed that do not reflect the nature of this encounter. Every attempt is made to correct such errors.  Rex Kras, [place], [facility]  Pager:  [phone] Office: [phone]

## 2022-10-25 DIAGNOSIS — Z1211 Encounter for screening for malignant neoplasm of colon: Secondary | ICD-10-CM | POA: Diagnosis not present

## 2022-10-25 DIAGNOSIS — Z0000 Encounter for general adult medical examination without abnormal findings: Secondary | ICD-10-CM | POA: Diagnosis not present

## 2022-10-25 DIAGNOSIS — N400 Enlarged prostate without lower urinary tract symptoms: Secondary | ICD-10-CM | POA: Diagnosis not present

## 2022-10-25 DIAGNOSIS — Z125 Encounter for screening for malignant neoplasm of prostate: Secondary | ICD-10-CM | POA: Diagnosis not present

## 2022-12-02 DIAGNOSIS — Z1211 Encounter for screening for malignant neoplasm of colon: Secondary | ICD-10-CM | POA: Diagnosis not present

## 2022-12-09 LAB — COLOGUARD: COLOGUARD: POSITIVE — AB

## 2023-01-05 ENCOUNTER — Other Ambulatory Visit: Payer: Self-pay | Admitting: Cardiology

## 2023-01-05 DIAGNOSIS — I493 Ventricular premature depolarization: Secondary | ICD-10-CM

## 2023-01-05 DIAGNOSIS — I10 Essential (primary) hypertension: Secondary | ICD-10-CM

## 2023-01-09 ENCOUNTER — Other Ambulatory Visit: Payer: Self-pay | Admitting: Cardiology

## 2023-01-09 DIAGNOSIS — I493 Ventricular premature depolarization: Secondary | ICD-10-CM

## 2023-01-09 DIAGNOSIS — I10 Essential (primary) hypertension: Secondary | ICD-10-CM

## 2023-01-21 ENCOUNTER — Encounter: Payer: Self-pay | Admitting: Cardiology

## 2023-01-21 NOTE — Telephone Encounter (Signed)
Happy to see for a visit and make necessary adjustements to medications tomorrow.  Thanks MJP

## 2023-01-22 ENCOUNTER — Ambulatory Visit: Payer: BC Managed Care – PPO | Admitting: Cardiology

## 2023-01-22 ENCOUNTER — Other Ambulatory Visit: Payer: Self-pay | Admitting: Cardiology

## 2023-01-22 ENCOUNTER — Encounter: Payer: Self-pay | Admitting: Cardiology

## 2023-01-22 VITALS — BP 167/97 | HR 75 | Resp 16 | Ht 71.0 in | Wt 239.0 lb

## 2023-01-22 DIAGNOSIS — I10 Essential (primary) hypertension: Secondary | ICD-10-CM | POA: Diagnosis not present

## 2023-01-22 DIAGNOSIS — G4733 Obstructive sleep apnea (adult) (pediatric): Secondary | ICD-10-CM | POA: Diagnosis not present

## 2023-01-22 MED ORDER — LABETALOL HCL 100 MG PO TABS
100.0000 mg | ORAL_TABLET | Freq: Two times a day (BID) | ORAL | 1 refills | Status: DC
Start: 2023-01-22 — End: 2023-01-22

## 2023-01-22 NOTE — Progress Notes (Signed)
Follow up visit  Subjective:   Phillip Fletcher, male    DOB: [birth year], 52 y.o.   MRN: [id number]   HPI  Chief Complaint  Patient presents with   Hypertension   Follow-up    52 y.o. African American male with hypertension. Hyperlipidemia, OSA , mitral regurgitation, PFO, family h/o early CAD  Today's appointment was made due to uncontrolled hypertension.  Patient was supposed to have root canal procedure for dental abscess this week, but was canceled due to elevated blood pressure.  Procedure is now scheduled for next Thursday, 01/31/2023.  Pressure has been elevated.  Patient checks it at home with a wrist blood pressure cuff which shows numbers around 150-180/120-130.  He is not sure if this cuff is reliable.  Blood pressure is elevated in the office today.  Patient has OSA, but does not use CPAP regularly.  He drinks 2-3 alcoholic drinks every day.  He does report eating lunches outside, and does not watch his salt intake.    Current Outpatient Medications:    aspirin EC 81 MG tablet, Take 1 tablet (81 mg total) by mouth daily. Swallow whole., Disp: 30 tablet, Rfl: 11   atorvastatin (LIPITOR) 20 MG tablet, TAKE 1 TABLET(20 MG) BY MOUTH AT BEDTIME, Disp: 90 tablet, Rfl: 3   diltiazem (CARDIZEM CD) 240 MG 24 hr capsule, TAKE 1 CAPSULE(240 MG) BY MOUTH EVERY MORNING, Disp: 90 capsule, Rfl: 0   hydrochlorothiazide (HYDRODIURIL) 25 MG tablet, TAKE 1 TABLET(25 MG) BY MOUTH EVERY MORNING, Disp: 90 tablet, Rfl: 0   ibuprofen (ADVIL,MOTRIN) 800 MG tablet, Take 1 tablet (800 mg total) by mouth every 8 (eight) hours as needed., Disp: 30 tablet, Rfl: 0   losartan (COZAAR) 100 MG tablet, TAKE 1 TABLET(100 MG) BY MOUTH DAILY AT 10 PM, Disp: 90 tablet, Rfl: 0   Multiple Vitamin (MULTIVITAMIN) tablet, Take 1 tablet by mouth daily., Disp: , Rfl:    Omega-3 Fatty Acids (FISH OIL PO), Take by mouth., Disp: , Rfl:    omeprazole (PRILOSEC) 20 MG capsule, Take 20 mg by mouth daily., Disp: , Rfl:     sildenafil (VIAGRA) 100 MG tablet, Take 1 tablet by mouth as needed for erectile dysfunction., Disp: , Rfl:    Cardiovascular & other pertient studies:  Reviewed external labs and tests, independently interpreted  EKG 10/08/2022: Sinus rhythm 74 bpm LVH  CT cardiac scoring 08/11/2021: LM: 0  LAD: 53.1 LCx: 9.6 RCA: 6.6   Total Agatston Score: 69.3 MESA database percentile: 92  Exercise treadmill stress test 07/18/2021: Exercise treadmill stress test performed using Bruce protocol.  Patient reached 13.5 METS, and 88% of age predicted maximum heart rate.  Exercise capacity was excellent.  No chest pain reported.  Normal heart rate and hemodynamic response. Stress EKG revealed no ischemic changes. Occasional PVC's seen during rest, stress, and recovery.  Low risk study.   Echocardiogram 10/01/2022: Left ventricle cavity is normal in size. Mild concentric hypertrophy of the left ventricle. Normal global wall motion. Normal LV systolic function with EF 55%. Doppler evidence of grade I (impaired) diastolic dysfunction, normal LAP. Left atrial cavity is mildly dilated. Aneurysmal interatrial septum without 2D or color Doppler evidence of shunting. Mild myxomatous degeneration or mitral valve. Mild to moderate, posteriorly directed mitral regurgitation. No evidence of pulmonary hypertension. Previous study on 07/03/2021 had reported moderate to severe mitral regurgitation, PFO-not well appreciated on this study.   Recent labs: 09/06/2021: Glucose 103, BUN/Cr 21/1.31. EGFR 66. Na/K 142/3.8.  H/H 14/41. MCV  83. Platelets 242    Review of Systems  Cardiovascular:  Negative for chest pain, dyspnea on exertion, leg swelling, palpitations and syncope.  Neurological:  Positive for headaches (when blood pressure is elevated).         Vitals:   01/22/23 1449  BP: (!) 167/97  Pulse: 75  Resp: 16  SpO2: 96%    Body mass index is 33.33 kg/m. Filed Weights   01/22/23 1449   Weight: 239 lb (108.4 kg)     Objective:   Physical Exam Vitals and nursing note reviewed.  Constitutional:      General: He is not in acute distress. Neck:     Vascular: No JVD.  Cardiovascular:     Rate and Rhythm: Normal rate and regular rhythm.     Heart sounds: Normal heart sounds. No murmur heard. Pulmonary:     Effort: Pulmonary effort is normal.     Breath sounds: Normal breath sounds. No wheezing or rales.  Musculoskeletal:     Right lower leg: No edema.     Left lower leg: No edema.             Visit diagnoses:   ICD-10-CM   1. Primary hypertension  I10 DISCONTINUED: labetalol (NORMODYNE) 100 MG tablet       Meds ordered this encounter  Medications   labetalol (NORMODYNE) 100 MG tablet    Sig: Take 1 tablet (100 mg total) by mouth 2 (two) times daily.    Dispense:  60 tablet    Refill:  1     Assessment & Recommendations:   52 y.o. African American male with hypertension. Hyperlipidemia, OSA , mitral regurgitation, PFO, family h/o early CAD  Hypertension: Uncontrolled.  Discussed risk factor modification, including using CPAP for OSA, reducing alcohol intake, reducing salt intake. In addition, given his timeline for upcoming dental procedure next week, I have added labetalol 100 mg twice daily.  This can be further increased to up to 200 mg twice daily or 300 mg twice daily.  Hopefully, if patient makes respect modification, this will not be a permanent change.  Recommend follow-up visit early next week to reassess his blood pressure before scheduled dental procedure later in the week.  He will continue follow-up with Dr. Odis Hollingshead.     Elder Negus, MD Pager: [phone] Office: [phone]

## 2023-01-30 ENCOUNTER — Ambulatory Visit: Payer: BC Managed Care – PPO | Admitting: Cardiology

## 2023-01-30 VITALS — BP 144/90 | HR 76 | Ht 71.0 in | Wt 235.0 lb

## 2023-01-30 DIAGNOSIS — I10 Essential (primary) hypertension: Secondary | ICD-10-CM

## 2023-01-30 DIAGNOSIS — G4733 Obstructive sleep apnea (adult) (pediatric): Secondary | ICD-10-CM

## 2023-01-30 MED ORDER — OLMESARTAN MEDOXOMIL-HCTZ 40-25 MG PO TABS
1.0000 | ORAL_TABLET | ORAL | 1 refills | Status: DC
Start: 2023-01-30 — End: 2023-04-24

## 2023-01-30 MED ORDER — LABETALOL HCL 200 MG PO TABS: 200.0000 mg | ORAL_TABLET | Freq: Two times a day (BID) | ORAL | 1 refills | Status: DC

## 2023-01-30 NOTE — Progress Notes (Signed)
Date:  01/30/2023   ID:  Phillip Fletcher, DOB [birth year], MRN [id number]  PCP:  Elie Confer, NP  Cardiologist:  Yates Decamp, DO, [facility] (established care 06/26/2021)  Date: 01/30/23 Last Office Visit: 03/27/2022  Chief Complaint  Patient presents with   Hypertension    HPI  Phillip Fletcher is a 52 y.o. African-American male whose past medical history and cardiac risk factors include: MVP and moderate Mitral regurgitation, PFO, premature ventricular contractions, hypertension, hyperlipidemia, erectile dysfunction, sleep apnea on CPAP and compliant, family history of premature CAD.  He is presently asymptomatic, presents for hypertension follow-up.  FUNCTIONAL STATUS: Runs at least 2 miles a day in addition to working in the gym for additional 30 to 60 minutes 6 days a week.  Does cardio exercises and strength training and is aware not to do heavy lifting due to mitral valve prolapse.  ALLERGIES: No Known Allergies  MEDICATION LIST PRIOR TO VISIT: Current Meds  Medication Sig   olmesartan-hydrochlorothiazide (BENICAR HCT) 40-25 MG tablet Take 1 tablet by mouth every morning.     PAST MEDICAL HISTORY: Past Medical History:  Diagnosis Date   Coronary artery calcification    Family history of premature CAD    GERD (gastroesophageal reflux disease)    HNP (herniated nucleus pulposus), cervical    Hyperlipidemia    Hypertension    states under control with meds., has been on med. x 20 yr.   Lipoma of flank 03/2017   left   Mitral regurgitation    OSA on CPAP    Premature ventricular contractions    Umbilical hernia 03/2017   Ventral hernia 03/2017    PAST SURGICAL HISTORY: Past Surgical History:  Procedure Laterality Date   INSERTION OF MESH N/A 04/10/2017   Procedure: INSERTION OF MESH;  Surgeon: Harriette Bouillon, MD;  Location: [facility];  Service: General;  Laterality: N/A;   LIPOMA EXCISION Left 04/10/2017   Procedure: EXCISION LIPOMA;   Surgeon: Harriette Bouillon, MD;  Location: [facility];  Service: General;  Laterality: Left;   NO PAST SURGERIES     TEE WITHOUT CARDIOVERSION N/A 09/13/2021   Procedure: TRANSESOPHAGEAL ECHOCARDIOGRAM (TEE);  Surgeon: Tessa Lerner, DO;  Location: [facility];  Service: Cardiovascular;  Laterality: N/A;   UMBILICAL HERNIA REPAIR N/A 04/10/2017   Procedure: HERNIA REPAIR UMBILICAL ADULT;  Surgeon: Harriette Bouillon, MD;  Location: [facility];  Service: General;  Laterality: N/A;   VENTRAL HERNIA REPAIR N/A 04/10/2017   Procedure: HERNIA REPAIR VENTRAL ADULT;  Surgeon: Harriette Bouillon, MD;  Location: [facility];  Service: General;  Laterality: N/A;    FAMILY HISTORY: The patient family history includes Heart attack in his brother, brother, brother, brother, and father; Hyperlipidemia in his mother; Hypertension in his mother.  Father at the age of 65 had a myocardial infarction and passed away. Brother at the age of 47 had a myocardial infarction and passed away. Brother at the age of 72 had a myocardial infarction and passed away. Brother at the age of 76 had his first CVA and passed away at the age of 60. Brother at the age of 70 currently alive but has had myocardial infarction, 2 PCI's at least, and AICD (per patient). Sister at the age of 58 has had valve replacement, currently alive.  SOCIAL HISTORY:  The patient  reports that he has never smoked. He has never used smokeless tobacco. He reports current alcohol use. He reports that he does  not use drugs.  REVIEW OF SYSTEMS: Review of Systems  Cardiovascular:  Negative for chest pain, dyspnea on exertion and leg swelling.   PHYSICAL EXAM:    01/30/2023    4:14 PM 01/22/2023    2:49 PM 10/08/2022    2:39 PM  Vitals with BMI  Height 5\' 11"  5\' 11"  5\' 11"   Weight 235 lbs 239 lbs 241 lbs 10 oz  BMI 32.79 33.35 33.71  Systolic 144 167 130  Diastolic 90 97 103  Pulse 76 75 85   Physical Exam   Neck: No JVD present.  Cardiovascular: Regular rhythm, intact distal pulses and normal pulses. Exam reveals no gallop.  Murmur heard. Mid to late systolic murmur is present with a grade of 3/6 at the apex. Pulmonary/Chest: Effort normal and breath sounds normal.  Abdominal: Soft. Bowel sounds are normal.  Musculoskeletal:        General: No edema.    CARDIAC DATABASE: EKG: 10/08/2022: Sinus rhythm, 74 bpm, LVH per voltage criteria, without underlying ischemia or injury pattern.  Echocardiogram: 07/03/2021: LVEF 67%, moderately dilated left atrium, small PFO cannot be excluded, mild mitral valve prolapse, moderate to severe MR.  TEE  09/13/2021:  1. Left ventricular ejection fraction, by estimation, is 60 to 65%. The left ventricle has normal function. The left ventricle has no regional wall motion abnormalities. There is mild left ventricular hypertrophy. Left ventricular diastolic function  could not be evaluated.  2. Right ventricular systolic function is normal. The right ventricular size is normal.  3. No left atrial/left atrial appendage thrombus was detected. The LAA emptying velocity was 60 cm/s.  4. Native mitral valve, mild leaflet thickness, mild bowing of anterior mitral leaflet, multiple jets, majority of regurgitation involves A2 and P2 scallops. Quantitative measures were difficult to obtain due to technical difficulties as noted above.  Moderate mitral valve regurgitation. No evidence of mitral stenosis.  5. The aortic valve is tricuspid. Aortic valve regurgitation is not visualized. No aortic stenosis is present.  6. No atrial level shunt detected by color flow Doppler, limited evaluation.  Echocardiogram 10/01/2022: Left ventricle cavity is normal in size. Mild concentric hypertrophy of the left ventricle. Normal global wall motion. Normal LV systolic function with EF 55%. Doppler evidence of grade I (impaired) diastolic dysfunction, normal LAP. Left atrial cavity is  mildly dilated. Aneurysmal interatrial septum without 2D or color Doppler evidence of shunting. Mild myxomatous degeneration or mitral valve. Mild to moderate, posteriorly directed mitral regurgitation. No evidence of pulmonary hypertension. Previous study on 07/03/2021 had reported moderate to severe mitral regurgitation, PFO-not well appreciated on this study.   Stress Testing: Exercise treadmill stress test 07/18/2021: Exercise treadmill stress test performed using Bruce protocol. Patient reached 13.5 METS, and 88% of age predicted maximum heart rate. Exercise capacity was excellent. No chest pain reported. Normal heart rate and hemodynamic response. Stress EKG revealed no ischemic changes. Occasional PVC's seen during rest, stress, and recovery.  Low risk study.   Heart Catheterization: None  CT Cardiac Scoring: 08/11/2021 Total CAC 69.3 AU, 92nd percentile for patient's age, sex, and race.  3 day extended Holter monitor: August 24, 2021- August 28, 2021 Dominant rhythm normal sinus. Heart rate 29-146 bpm.  Avg HR 81 bpm. Minimum heart rate : 29 bpm on 08/26/2021 at 4:56 AM (sinus bradycardia, second-degree type I AV block with PVCs). No atrial fibrillation, supraventricular tachycardia, high grade AV block, pauses (3 seconds or longer). Total ventricular ectopic burden 10.1% (predominantly isolated PVCs). Total supraventricular ectopic  burden <1%. Patient triggered events: 0.  Cardiac monitor (Zio Patch): 09/07/2022 - 09/14/2022 Dominant rhythm sinus. Heart rate 26-150 bpm.  Avg HR 78 bpm. Minimum heart rate 26 bpm occurred on 09/09/2022 and 6:58 PM -underlying rhythm sinus bradycardia with second-degree type I AV block with rare PVC. No atrial fibrillation, supraventricular tachycardia, ventricular tachycardia, high grade AV block, pauses (3 seconds or longer). Total ventricular ectopic burden 5.1%. Total supraventricular ectopic burden <1%. Patient triggered events: 0.    LABORATORY DATA:    Latest Ref Rng & Units 09/06/2021    4:10 PM 03/05/2017   12:36 AM  CBC  WBC 3.4 - 10.8 x10E3/uL 5.2  6.1   Hemoglobin 13.0 - 17.7 g/dL 72.5  36.6   Hematocrit 37.5 - 51.0 % 41.9  37.1   Platelets 150 - 450 x10E3/uL 242  238        Latest Ref Rng & Units 09/06/2021    4:10 PM 04/05/2017    9:35 AM 03/05/2017   12:36 AM  CMP  Glucose 70 - 99 mg/dL 440  97  347   BUN 6 - 24 mg/dL 21  17  18    Creatinine 0.76 - 1.27 mg/dL 4.25  9.56  3.87   Sodium 134 - 144 mmol/L 142  138  137   Potassium 3.5 - 5.2 mmol/L 3.8  4.0  3.5   Chloride 96 - 106 mmol/L 102  105  102   CO2 20 - 29 mmol/L 25  26  27    Calcium 8.7 - 10.2 mg/dL 56.4  9.6  9.3   Total Protein 6.5 - 8.1 g/dL   7.4   Total Bilirubin 0.3 - 1.2 mg/dL   0.8   Alkaline Phos 38 - 126 U/L   33   AST 15 - 41 U/L   32   ALT 17 - 63 U/L   30     External Labs: Collected: 03/10/2021 Total cholesterol 97, HDL 61, triglycerides 89, LDL 117, non-HDL 136. BUN 19, creatinine 0.99. eGFR 93. Sodium 140, potassium 4.2, chloride 103, bicarb 28. AST 37, ALT 39, alkaline phosphatase 40 Hemoglobin 14.7 g/dL.  Hematocrit 44.8% TSH 2.15 A1c 5.5   IMPRESSION:    ICD-10-CM   1. Primary hypertension  I10 labetalol (NORMODYNE) 200 MG tablet    olmesartan-hydrochlorothiazide (BENICAR HCT) 40-25 MG tablet    Basic metabolic panel    2. OSA on CPAP  G47.33        RECOMMENDATIONS: Keithan Dileonardo is a 52 y.o. African-American male whose past medical history and cardiac risk factors include: MVP and moderate Mitral regurgitation, PFO, premature ventricular contractions, hypertension, hyperlipidemia, erectile dysfunction, sleep apnea on CPAP and compliant, family history of premature CAD.  1. Primary hypertension Patient was seen 3 weeks ago and was started on labetalol 100 mg twice daily, blood pressure has continued to remain high.  Will increase it to 200 mg p.o. twice daily, I will also discontinue losartan and  hydrochlorothiazide and switch him to olmesartan HCT as a combination pill at 40/25 mg in the morning.  He will obtain BMP in 2 to 3 weeks after making the medication change.  - labetalol (NORMODYNE) 200 MG tablet; Take 1 tablet (200 mg total) by mouth 2 (two) times daily.  Dispense: 180 tablet; Refill: 1 - olmesartan-hydrochlorothiazide (BENICAR HCT) 40-25 MG tablet; Take 1 tablet by mouth every morning.  Dispense: 90 tablet; Refill: 1 - Basic metabolic panel  2. OSA on CPAP (obstructive sleep apnea) Patient  has been compliant with sleep apnea treatment with CPAP.  Importance of compliance and hypertension discussed.  If blood pressure still remains elevated, consider addition of Inspra which will certainly help with blood pressure control especially in view of OSA, dark skinned individual.  Although he is presently on an ARB, we could still consider obtaining Aldosterone plus renin activity with ratio to exclude adrenal adenoma.  I will set him up to be seen by Dr. Odis Hollingshead in 6 weeks.   Premature ventricular contraction PVC burden approximately 10% as of February 2023. PVC burden approximately 5.1% as of March 2024 remains asymptomatic clinically Increase diltiazem from 180 mg to 240 mg p.o. daily. Echocardiogram notes preserved LVEF. Monitor for now  Agatston coronary artery calcium score less than 100 Mixed hyperlipidemia Total CAC 69.3 AU, 92nd percentile.  Continue aspirin and statin therapy.   Denies anginal chest pain or heart failure symptoms. 30-day refill provided for atorvastatin. Will have follow-up labs with PCP in the coming weeks when he goes for annual well visit.  Nonrheumatic mitral valve regurgitation Asymptomatic. Severity of MR remains relatively stable. Reemphasized importance of blood pressure management. Educated him on the importance of not lifting heavy objects or prolonged isometric contractions that would lead to Valsalva maneuver.  Benign  hypertension Office blood pressures are not well-controlled. Home blood pressures are now monitored. Patient requesting refills on medications. Discontinue losartan/HCTZ and transition to losartan at night, HCTZ in the morning. Anticipate that he will need up titration of antihypertensive medications but I have asked him to keep a log of his blood pressures and to follow-up with either myself or PCP for up titration of medical therapy based on home blood pressure logs. Also encouraged him to use his CPAP regularly and to increase the usage (on the number of hours per night) to decrease  I have encouraged him the importance of annual follow-up visits/yearly physicals with PCP.  He will provide [place] a copy of his labs when available.  I like to see him back in at least 6 months to make sure that the blood pressures have come down nicely and or sooner if needed.  FINAL MEDICATION LIST END OF ENCOUNTER: Meds ordered this encounter  Medications   labetalol (NORMODYNE) 200 MG tablet    Sig: Take 1 tablet (200 mg total) by mouth 2 (two) times daily.    Dispense:  180 tablet    Refill:  1   olmesartan-hydrochlorothiazide (BENICAR HCT) 40-25 MG tablet    Sig: Take 1 tablet by mouth every morning.    Dispense:  90 tablet    Refill:  1    Discontinue losartan and hydrochlorothiazide.   Medications Discontinued During This Encounter  Medication Reason   ibuprofen (ADVIL,MOTRIN) 800 MG tablet    losartan (COZAAR) 100 MG tablet Change in therapy   hydrochlorothiazide (HYDRODIURIL) 25 MG tablet Change in therapy   labetalol (NORMODYNE) 100 MG tablet Reorder     Current Outpatient Medications:    olmesartan-hydrochlorothiazide (BENICAR HCT) 40-25 MG tablet, Take 1 tablet by mouth every morning., Disp: 90 tablet, Rfl: 1   aspirin EC 81 MG tablet, Take 1 tablet (81 mg total) by mouth daily. Swallow whole., Disp: 30 tablet, Rfl: 11   atorvastatin (LIPITOR) 20 MG tablet, TAKE 1 TABLET(20 MG) BY MOUTH AT  BEDTIME, Disp: 90 tablet, Rfl: 3   diltiazem (CARDIZEM CD) 240 MG 24 hr capsule, TAKE 1 CAPSULE(240 MG) BY MOUTH EVERY MORNING, Disp: 90 capsule, Rfl: 0   labetalol (NORMODYNE)  200 MG tablet, Take 1 tablet (200 mg total) by mouth 2 (two) times daily., Disp: 180 tablet, Rfl: 1   Multiple Vitamin (MULTIVITAMIN) tablet, Take 1 tablet by mouth daily., Disp: , Rfl:    Omega-3 Fatty Acids (FISH OIL PO), Take by mouth., Disp: , Rfl:    omeprazole (PRILOSEC) 20 MG capsule, Take 20 mg by mouth daily., Disp: , Rfl:    tadalafil (CIALIS) 5 MG tablet, Take 1 tablet by mouth daily., Disp: , Rfl:   Orders Placed This Encounter  Procedures   Basic metabolic panel    Patient Instructions  I have stopped your losartan and hydrochlorothiazide and switched you to olmesartan HCT which you will take 1 tablet once a day in the morning 40/25 mg.  Increase your labetalol from 100 mg twice daily to 200 mg twice daily.  Prescription has been sent for new dose.  3 weeks after you make the changes to the medications, I would like you to go to [facility] to get blood work done, no fasting necessary.   --Continue cardiac medications as reconciled in final medication list. --Return in about 6 weeks (around 03/13/2023) for Hypertension. Or sooner if needed. --Continue follow-up with your primary care physician regarding the management of your other chronic comorbid conditions.  Patient's questions and concerns were addressed to his satisfaction. He voices understanding of the instructions provided during this encounter.    Yates Decamp, MD, [facility] 01/30/2023, 4:56 PM Office: [phone] Fax: [phone] Pager: [phone]

## 2023-01-30 NOTE — Patient Instructions (Signed)
I have stopped your losartan and hydrochlorothiazide and switched you to olmesartan HCT which you will take 1 tablet once a day in the morning 40/25 mg.  Increase your labetalol from 100 mg twice daily to 200 mg twice daily.  Prescription has been sent for new dose.  3 weeks after you make the changes to the medications, I would like you to go to [facility] to get blood work done, no fasting necessary.

## 2023-03-14 ENCOUNTER — Ambulatory Visit: Payer: BC Managed Care – PPO | Admitting: Cardiology

## 2023-04-18 ENCOUNTER — Other Ambulatory Visit: Payer: Self-pay | Admitting: Cardiology

## 2023-04-18 DIAGNOSIS — I493 Ventricular premature depolarization: Secondary | ICD-10-CM

## 2023-04-18 DIAGNOSIS — I10 Essential (primary) hypertension: Secondary | ICD-10-CM

## 2023-04-19 ENCOUNTER — Ambulatory Visit: Payer: BC Managed Care – PPO | Admitting: Cardiology

## 2023-04-19 LAB — BASIC METABOLIC PANEL
BUN/Creatinine Ratio: 15 (ref 9–20)
BUN: 17 mg/dL (ref 6–24)
CO2: 24 mmol/L (ref 20–29)
Calcium: 9.8 mg/dL (ref 8.7–10.2)
Chloride: 101 mmol/L (ref 96–106)
Creatinine, Ser: 1.11 mg/dL (ref 0.76–1.27)
Glucose: 88 mg/dL (ref 70–99)
Potassium: 4 mmol/L (ref 3.5–5.2)
Sodium: 138 mmol/L (ref 134–144)
eGFR: 80 mL/min/{1.73_m2} (ref >59)

## 2023-04-24 ENCOUNTER — Ambulatory Visit: Payer: BC Managed Care – PPO | Attending: Cardiology | Admitting: Cardiology

## 2023-04-24 ENCOUNTER — Encounter: Payer: Self-pay | Admitting: Cardiology

## 2023-04-24 VITALS — BP 160/105 | HR 70 | Resp 16 | Ht 71.0 in | Wt 236.8 lb

## 2023-04-24 DIAGNOSIS — I10 Essential (primary) hypertension: Secondary | ICD-10-CM | POA: Diagnosis not present

## 2023-04-24 DIAGNOSIS — I340 Nonrheumatic mitral (valve) insufficiency: Secondary | ICD-10-CM

## 2023-04-24 DIAGNOSIS — Z8249 Family history of ischemic heart disease and other diseases of the circulatory system: Secondary | ICD-10-CM

## 2023-04-24 DIAGNOSIS — G4733 Obstructive sleep apnea (adult) (pediatric): Secondary | ICD-10-CM

## 2023-04-24 DIAGNOSIS — R931 Abnormal findings on diagnostic imaging of heart and coronary circulation: Secondary | ICD-10-CM | POA: Diagnosis not present

## 2023-04-24 DIAGNOSIS — I493 Ventricular premature depolarization: Secondary | ICD-10-CM | POA: Diagnosis not present

## 2023-04-24 DIAGNOSIS — E782 Mixed hyperlipidemia: Secondary | ICD-10-CM

## 2023-04-24 MED ORDER — LABETALOL HCL 200 MG PO TABS
200.0000 mg | ORAL_TABLET | Freq: Two times a day (BID) | ORAL | 0 refills | Status: DC
Start: 2023-04-24 — End: 2023-08-06

## 2023-04-24 MED ORDER — OLMESARTAN MEDOXOMIL-HCTZ 40-25 MG PO TABS
1.0000 | ORAL_TABLET | ORAL | 1 refills | Status: DC
Start: 2023-04-24 — End: 2023-10-31

## 2023-04-24 MED ORDER — DILTIAZEM HCL ER COATED BEADS 240 MG PO CP24
240.0000 mg | ORAL_CAPSULE | Freq: Every day | ORAL | 1 refills | Status: DC
Start: 2023-04-24 — End: 2024-04-08

## 2023-04-24 NOTE — Patient Instructions (Signed)
Medication Instructions:  Your physician recommends that you continue on your current medications as directed. Please refer to the Current Medication list given to you today.  *If you need a refill on your cardiac medications before your next appointment, please call your pharmacy*  Lab Work: None ordered today.  Testing/Procedures: Your physician has requested that you have an echocardiogram (1-2 weeks prior to next office visit). Echocardiography is a painless test that uses sound waves to create images of your heart. It provides your doctor with information about the size and shape of your heart and how well your heart's chambers and valves are working. This procedure takes approximately one hour. There are no restrictions for this procedure. Please do NOT wear cologne, perfume, aftershave, or lotions (deodorant is allowed). Please arrive 15 minutes prior to your appointment time.  Follow-Up: At [facility], you and your health needs are our priority.  As part of our continuing mission to provide you with exceptional heart care, we have created designated Provider Care Teams.  These Care Teams include your primary Cardiologist (physician) and Advanced Practice Providers (APPs -  Physician Assistants and Nurse Practitioners) who all work together to provide you with the care you need, when you need it.  We recommend signing up for the patient portal called "MyChart".  Sign up information is provided on this After Visit Summary.  MyChart is used to connect with patients for Virtual Visits (Telemedicine).  Patients are able to view lab/test results, encounter notes, upcoming appointments, etc.  Non-urgent messages can be sent to your provider as well.   To learn more about what you can do with MyChart, go to [email or web address].    Your next appointment:   12 month(s)  The format for your next appointment:   In Person  Provider:   Tessa Lerner, DO {

## 2023-04-24 NOTE — Progress Notes (Signed)
Cardiology Office Note:  .   Date:  04/24/2023  ID:  Phillip Fletcher, DOB February 28, 1971, MRN [id number] PCP:  Elie Confer, NP  Former Cardiology Providers: None [facility] Providers Cardiologist:  Tessa Lerner, DO , [facility] (established care 06/26/2021) Electrophysiologist:  None  Click to update primary MD,subspecialty MD or APP then REFRESH:1}    Chief Complaint  Patient presents with   Hypertension   Follow-up   PVC    History of Present Illness: Phillip Fletcher is a 52 y.o. African-American male whose past medical history and cardiovascular risk factors includes: Mitral regurgitation, PFO, premature ventricular contractions, hypertension, hyperlipidemia, erectile dysfunction, sleep apnea on CPAP, family history of premature CAD.   Referred to the office back in December 2022 for evaluation of cardiovascular disease given his family history of premature CAD.  Coronary calcium score noted a total CAC 69.3 AU placing him at the 92nd percentile and GXT noted excellent functional capacity for age and overall low risk study.  The exercise stress test noted PVCs at rest, stress, and within recovery.  He underwent a cardiac monitor and was noted to have a PVC burden of approximately 10%.  He was started on diltiazem which he tolerated well and his palpitations essentially resolved.  In the past, transthoracic echo noted moderate/severe MR and follow up TEE noted atleast moderate MR due to mild bowing of anterior mitral leaflet, multiple jets, majority of regurgitation involves A2 and P2 scallops.   Patient presents today for 67-month follow-up visit with a chief complaint of follow-up on mitral valve disease and blood pressure management.  Clinically doing well from a cardiovascular standpoint.  Denies anginal chest pain.  Intermittently does experience dyspnea on exertion.  He is doing well after going up on labetalol to 200 mg p.o. twice daily.  His home blood pressures still  remain labile with morning readings between 120-140 mmHg.  His office blood pressures today are now well-controlled he has ran out of his medication.  Patient requesting refill.  He is also noncompliant with his CPAP on a regular basis.  FUNCTIONAL STATUS: Runs at least 2 miles a day in addition to working out additional 30 to 60 minutes 6 days a week.  Review of Systems: .   Review of Systems  Cardiovascular:  Negative for chest pain, dyspnea on exertion and leg swelling.    Studies Reviewed:   EKG: 10/08/2022: Sinus rhythm, 74 bpm, LVH per voltage criteria, without underlying ischemia or injury pattern.   Echocardiogram: 07/03/2021: LVEF 67%, moderately dilated left atrium, small PFO cannot be excluded, mild mitral valve prolapse, moderate to severe MR.   TEE  09/13/2021:  1. Left ventricular ejection fraction, by estimation, is 60 to 65%. The  left ventricle has normal function. The left ventricle has no regional  wall motion abnormalities. There is mild left ventricular hypertrophy.  Left ventricular diastolic function  could not be evaluated.   2. Right ventricular systolic function is normal. The right ventricular  size is normal.   3. No left atrial/left atrial appendage thrombus was detected. The LAA  emptying velocity was 60 cm/s.   4. Native mitral valve, mild leaflet thickness, mild bowing of anterior  mitral leaflet, multiple jets, majority of regurgitation involves A2 and  P2 scallops. Quantitative measures were difficult to obtain due to  technical difficulties as noted above.  Moderate mitral valve regurgitation. No evidence of mitral stenosis.   5. The aortic valve is tricuspid. Aortic valve  regurgitation is not  visualized. No aortic stenosis is present.   6. No atrial level shunt detected by color flow Doppler, limited  evaluation.    10/01/2022:  Left ventricle cavity is normal in size. Mild concentric hypertrophy of the left ventricle. Normal global wall  motion. Normal LV systolic function with EF 55%. Doppler evidence of grade I (impaired) diastolic dysfunction, normal LAP.  Left atrial cavity is mildly dilated. Aneurysmal interatrial septum without 2D or color Doppler evidence of shunting.  Mild myxomatous degeneration or mitral valve. Mild to moderate, posteriorly directed mitral regurgitation.  No evidence of pulmonary hypertension.  Previous study on 07/03/2021 had reported moderate to severe mitral regurgitation, PFO-not well appreciated on this study.      Stress Testing: Exercise treadmill stress test 07/18/2021: Exercise treadmill stress test performed using Bruce protocol. Patient reached 13.5 METS, and 88% of age predicted maximum heart rate. Exercise capacity was excellent. No chest pain reported. Normal heart rate and hemodynamic response. Stress EKG revealed no ischemic changes. Occasional PVC's seen during rest, stress, and recovery.  Low risk study.    Heart Catheterization: None   CT Cardiac Scoring: 08/11/2021 Total CAC 69.3 AU, 92nd percentile for patient's age, sex, and race.   3 day extended Holter monitor: August 24, 2021- August 28, 2021 Dominant rhythm normal sinus. Heart rate 29-146 bpm.  Avg HR 81 bpm. Minimum heart rate : 29 bpm on 08/26/2021 at 4:56 AM (sinus bradycardia, second-degree type I AV block with PVCs). No atrial fibrillation, supraventricular tachycardia, high grade AV block, pauses (3 seconds or longer). Total ventricular ectopic burden 10.1% (predominantly isolated PVCs). Total supraventricular ectopic burden <1%. Patient triggered events: 0.   Cardiac monitor (Zio Patch): 09/07/2022 - 09/14/2022 Dominant rhythm sinus. Heart rate 26-150 bpm.  Avg HR 78 bpm. Minimum heart rate 26 bpm occurred on 09/09/2022 and 6:58 PM -underlying rhythm sinus bradycardia with second-degree type I AV block with rare PVC. No atrial fibrillation, supraventricular tachycardia, ventricular tachycardia, high grade AV  block, pauses (3 seconds or longer). Total ventricular ectopic burden 5.1%. Total supraventricular ectopic burden <1%. Patient triggered events: 0.   RADIOLOGY: NA  Risk Assessment/Calculations:   NA  Labs:       Latest Ref Rng & Units 09/06/2021    4:10 PM 03/05/2017   12:36 AM  CBC  WBC 3.4 - 10.8 x10E3/uL 5.2  6.1   Hemoglobin 13.0 - 17.7 g/dL 78.2  95.6   Hematocrit 37.5 - 51.0 % 41.9  37.1   Platelets 150 - 450 x10E3/uL 242  238        Latest Ref Rng & Units 04/18/2023    7:50 AM 09/06/2021    4:10 PM 04/05/2017    9:35 AM  BMP  Glucose 70 - 99 mg/dL 88  213  97   BUN 6 - 24 mg/dL 17  21  17    Creatinine 0.76 - 1.27 mg/dL 0.86  5.78  4.69   BUN/Creat Ratio 9 - 20 15  16     Sodium 134 - 144 mmol/L 138  142  138   Potassium 3.5 - 5.2 mmol/L 4.0  3.8  4.0   Chloride 96 - 106 mmol/L 101  102  105   CO2 20 - 29 mmol/L 24  25  26    Calcium 8.7 - 10.2 mg/dL 9.8  62.9  9.6       Latest Ref Rng & Units 04/18/2023    7:50 AM 09/06/2021    4:10 PM 04/05/2017  9:35 AM  CMP  Glucose 70 - 99 mg/dL 88  161  97   BUN 6 - 24 mg/dL 17  21  17    Creatinine 0.76 - 1.27 mg/dL 0.96  0.45  4.09   Sodium 134 - 144 mmol/L 138  142  138   Potassium 3.5 - 5.2 mmol/L 4.0  3.8  4.0   Chloride 96 - 106 mmol/L 101  102  105   CO2 20 - 29 mmol/L 24  25  26    Calcium 8.7 - 10.2 mg/dL 9.8  81.1  9.6     No results found for: "CHOL", "HDL", "LDLCALC", "LDLDIRECT", "TRIG", "CHOLHDL" No results for input(s): "LIPOA" in the last 8760 hours. No components found for: "NTPROBNP" No results for input(s): "PROBNP" in the last 8760 hours. No results for input(s): "TSH" in the last 8760 hours.  Physical Exam:    Today's Vitals   04/24/23 0911  BP: (!) 160/105  Pulse: 70  Resp: 16  SpO2: 95%  Weight: 236 lb 12.8 oz (107.4 kg)  Height: 5\' 11"  (1.803 m)   Body mass index is 33.03 kg/m. Wt Readings from Last 3 Encounters:  04/24/23 236 lb 12.8 oz (107.4 kg)  01/30/23 235 lb (106.6 kg)   01/22/23 239 lb (108.4 kg)    Physical Exam  Neck: No JVD present.  Cardiovascular: Regular rhythm, intact distal pulses and normal pulses. Exam reveals no gallop.  Murmur heard. Mid to late systolic murmur is present with a grade of 3/6 at the apex. Pulmonary/Chest: Effort normal and breath sounds normal.  Abdominal: Soft. Bowel sounds are normal.  Musculoskeletal:        General: No edema.     Impression & Recommendation(s):  Impression:   ICD-10-CM   1. Premature ventricular contraction  I49.3 diltiazem (CARDIZEM CD) 240 MG 24 hr capsule    2. Agatston coronary artery calcium score less than 100  R93.1     3. Nonrheumatic mitral valve regurgitation  I34.0 ECHOCARDIOGRAM COMPLETE    4. Primary hypertension  I10 olmesartan-hydrochlorothiazide (BENICAR HCT) 40-25 MG tablet    labetalol (NORMODYNE) 200 MG tablet    5. Mixed hyperlipidemia  E78.2     6. Family history of premature CAD  Z82.49     7. OSA on CPAP  G47.33        Recommendation(s):  Premature ventricular contraction February 2023 PVC burden 10%. March 2024 PVC burden 5.1%. Clinically asymptomatic. Continue diltiazem-refilled Cardizem 240 mg p.o. daily  Agatston coronary artery calcium score less than 100 Total CAC 69.3 AU, 92nd percentile.  Continue aspirin and statin therapy. Does not endorse angina pectoris. Monitor for now. No change in overall physical endurance.  Nonrheumatic mitral valve regurgitation Asymptomatic. 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 Reemphasized importance of blood pressure management. Will repeat a annual echocardiogram prior to next office visit  Primary hypertension Office blood pressures are not at goal. He ran out of his blood pressure medications. Labetalol, diltiazem, olmesartan/hydrochlorothiazide refilled Once he restarts his medications he will call us back with his range.  Given his  valvular heart disease would like to keep his systolic blood pressures are 120 mmHg. May consider addition of spironolactone if additional medical therapy is warranted  OSA on CPAP Patient states that he is compliant with his CPAP more since the last office visit   Orders Placed:  Orders Placed This Encounter  Procedures  ECHOCARDIOGRAM COMPLETE    Standing Status:   Future    Standing Expiration Date:   04/23/2024    Order Specific Question:   Where should this test be performed    Answer:   [facility])    Order Specific Question:   Does the patient weigh less than or greater than 250 lbs?    Answer:   Patient weighs less than 250 lbs    Order Specific Question:   Perflutren DEFINITY (image enhancing agent) should be administered unless hypersensitivity or allergy exist    Answer:   Administer Perflutren    Order Specific Question:   Reason for exam-Echo    Answer:   Other-Full Diagnosis List    Order Specific Question:   Full ICD-10/Reason for Exam    Answer:   Mitral valve regurgitation [201049]   As part of medical decision making results of the prior Zio patch, echo results, blood pressure log were reviewed independently at today's visit.   Final Medication List:    Meds ordered this encounter  Medications   olmesartan-hydrochlorothiazide (BENICAR HCT) 40-25 MG tablet    Sig: Take 1 tablet by mouth every morning.    Dispense:  90 tablet    Refill:  1    Discontinue losartan and hydrochlorothiazide.   labetalol (NORMODYNE) 200 MG tablet    Sig: Take 1 tablet (200 mg total) by mouth 2 (two) times daily.    Dispense:  180 tablet    Refill:  0   diltiazem (CARDIZEM CD) 240 MG 24 hr capsule    Sig: Take 1 capsule (240 mg total) by mouth daily.    Dispense:  90 capsule    Refill:  1    Medications Discontinued During This Encounter  Medication Reason   labetalol (NORMODYNE) 200 MG tablet Reorder   olmesartan-hydrochlorothiazide (BENICAR HCT) 40-25  MG tablet Reorder   diltiazem (CARDIZEM CD) 240 MG 24 hr capsule Reorder     Current Outpatient Medications:    aspirin EC 81 MG tablet, Take 1 tablet (81 mg total) by mouth daily. Swallow whole., Disp: 30 tablet, Rfl: 11   atorvastatin (LIPITOR) 20 MG tablet, TAKE 1 TABLET(20 MG) BY MOUTH AT BEDTIME, Disp: 90 tablet, Rfl: 3   Multiple Vitamin (MULTIVITAMIN) tablet, Take 1 tablet by mouth daily., Disp: , Rfl:    Omega-3 Fatty Acids (FISH OIL PO), Take by mouth., Disp: , Rfl:    omeprazole (PRILOSEC) 20 MG capsule, Take 20 mg by mouth daily., Disp: , Rfl:    tadalafil (CIALIS) 5 MG tablet, Take 1 tablet by mouth daily., Disp: , Rfl:    diltiazem (CARDIZEM CD) 240 MG 24 hr capsule, Take 1 capsule (240 mg total) by mouth daily., Disp: 90 capsule, Rfl: 1   labetalol (NORMODYNE) 200 MG tablet, Take 1 tablet (200 mg total) by mouth 2 (two) times daily., Disp: 180 tablet, Rfl: 0   olmesartan-hydrochlorothiazide (BENICAR HCT) 40-25 MG tablet, Take 1 tablet by mouth every morning., Disp: 90 tablet, Rfl: 1  Consent:      NA    Disposition:   Return in about 1 year (around 04/23/2024) for Hypertension, PVC, mitral valve disease. or sooner if needed.  His questions and concerns were addressed to his satisfaction. He voices understanding of the recommendations provided during this encounter.    Signed, Tessa Lerner, DO, [facility] [place]  [facility]  [address] [place], [place] [address] [phone] 04/24/2023 12:58 PM

## 2023-08-04 ENCOUNTER — Other Ambulatory Visit: Payer: Self-pay | Admitting: Cardiology

## 2023-08-04 DIAGNOSIS — I10 Essential (primary) hypertension: Secondary | ICD-10-CM

## 2023-09-23 IMAGING — CT CT CARDIAC CORONARY ARTERY CALCIUM SCORE
3 series · 14 of 20 positions shown, 16 images · non-contrast
Comparison: None.

CLINICAL DATA: 50-year-old African American male with history of
hypertension, hyperlipidemia and family history of heart disease.

EXAM:
CT CARDIAC CORONARY ARTERY CALCIUM SCORE
TECHNIQUE: Non-contrast imaging through the heart was performed using
prospective ECG gating. Image post processing was performed on an
independent workstation, allowing for quantitative analysis of the
heart and coronary arteries. Note that this exam targets the heart
and the chest was not imaged in its entirety.

[Series 2: calcium scoring 2.00 qr36 bestdiast 71% hrt calciu · axial · 0.41mm/px · z∈[+1775,+1867]mm · 4 of 78 slices shown]
[im 16/78  vessel]
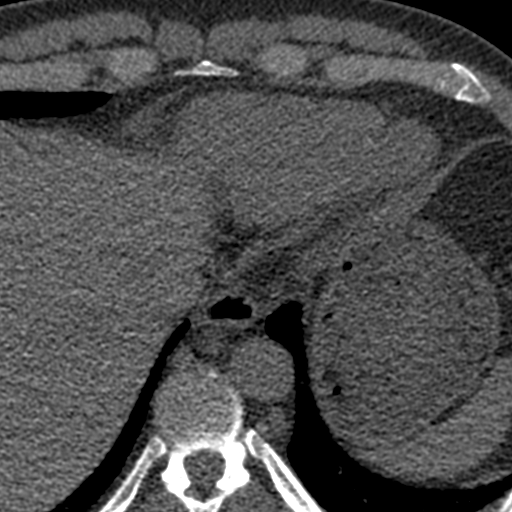
[im 31/78  vessel]
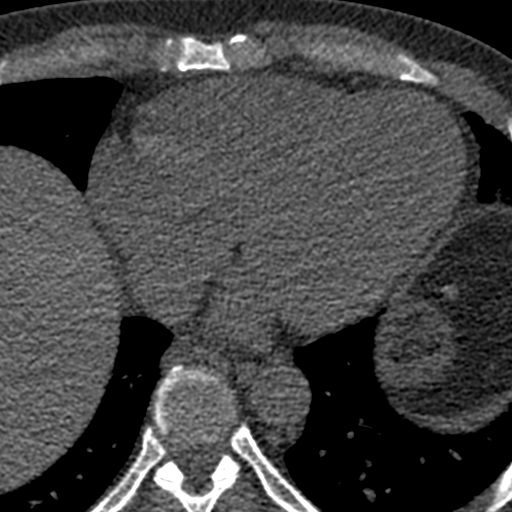
[im 47/78  vessel]
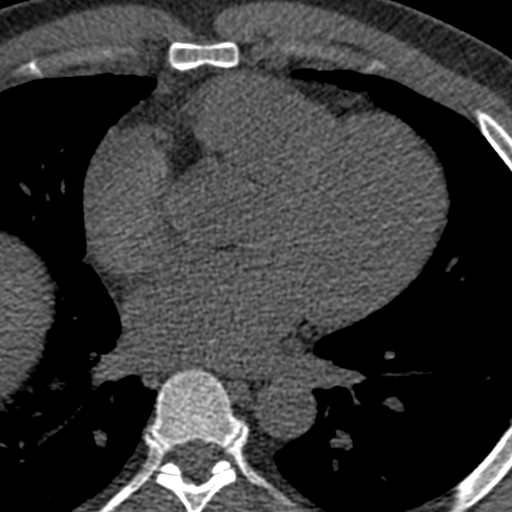
[im 62/78  vessel]
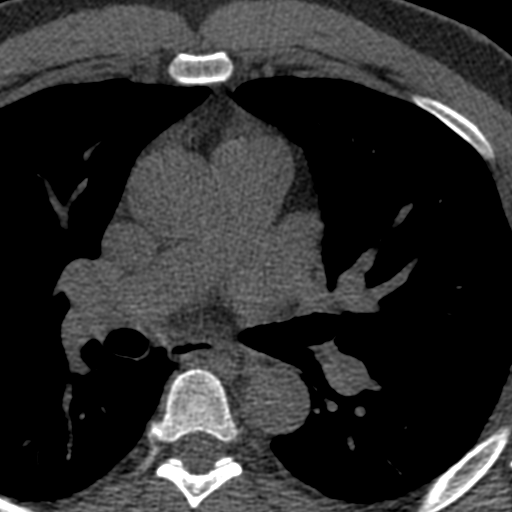

[Series 3: calcium scoring 2.00 br40 bestdiast 71% axial · axial · 0.63mm/px · z∈[+1767,+1871]mm · 5 of 80 slices shown, 7 images]
[im 14/80  vessel]
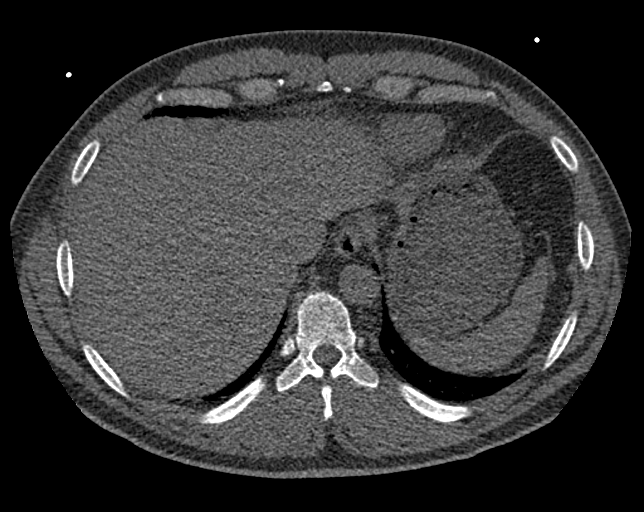
[im 14/80  lung]
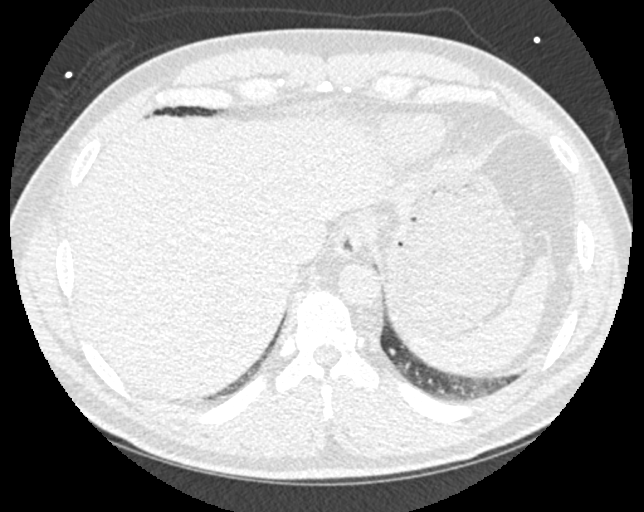
[im 27/80  vessel]
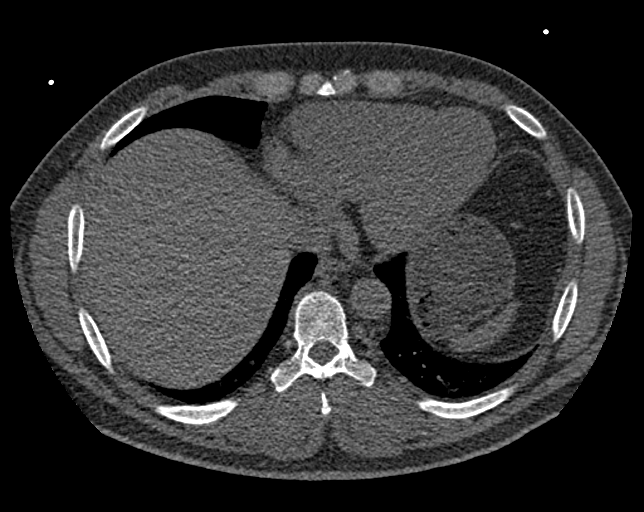
[im 40/80  vessel]
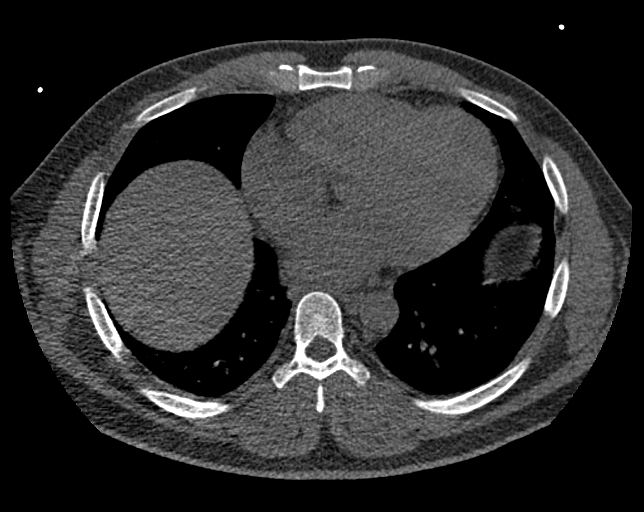
[im 53/80  vessel]
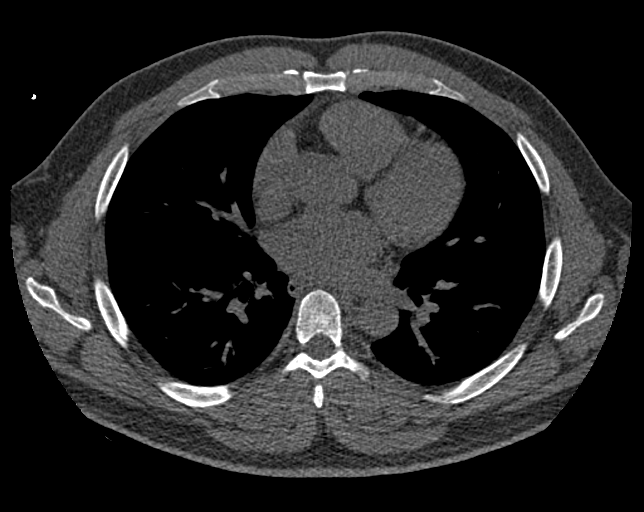
[im 66/80  vessel]
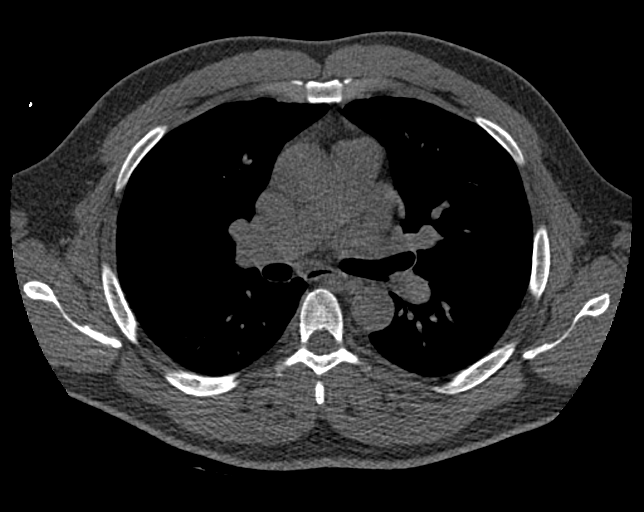
[im 66/80  lung]
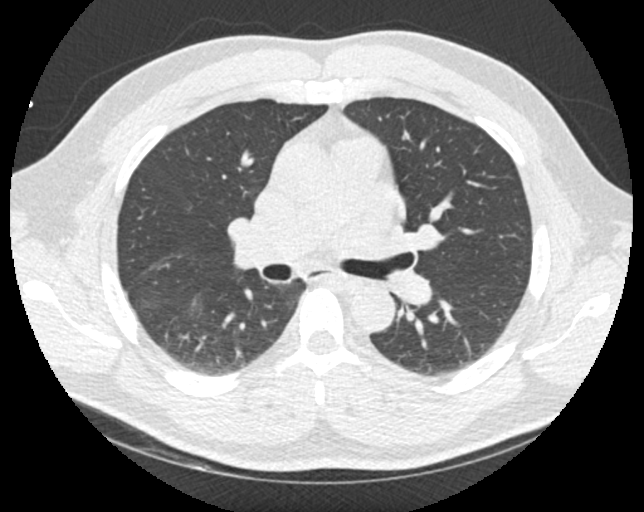

[Series 9: calcium scoring 2.00 br60 bestdiast 71% lungs · axial · 0.63mm/px · z∈[+1767,+1871]mm · 5 of 80 slices shown]
[im 14/80  vessel]
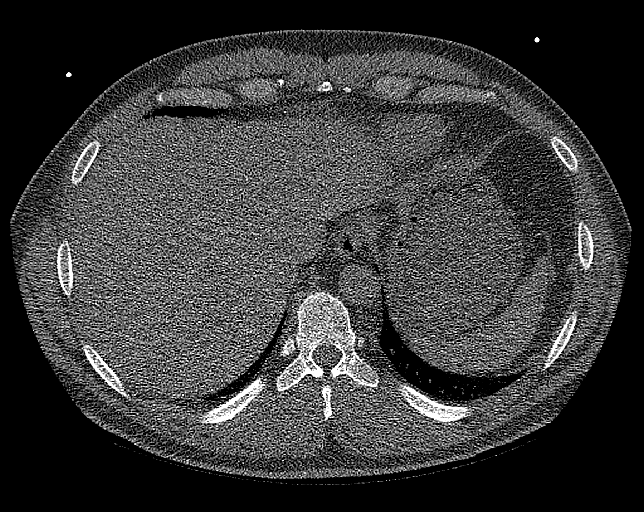
[im 27/80  vessel]
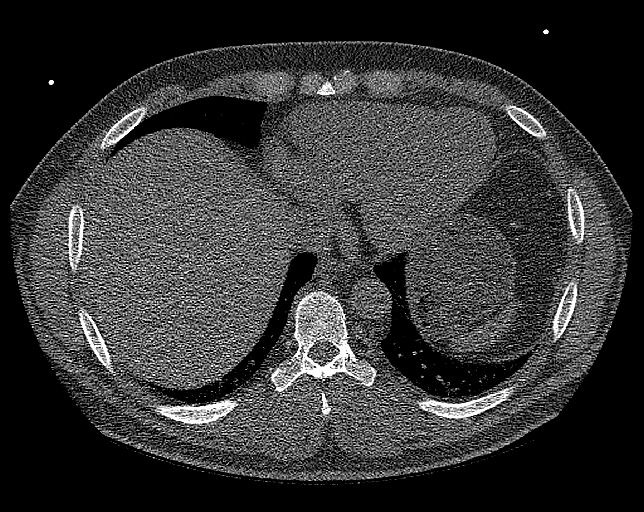
[im 40/80  vessel]
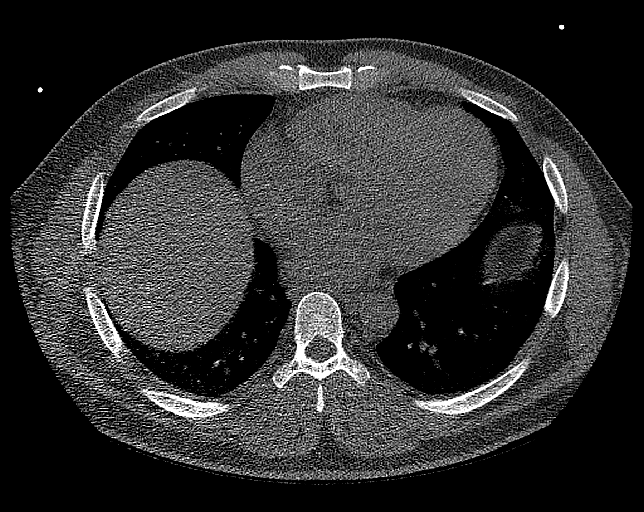
[im 53/80  vessel]
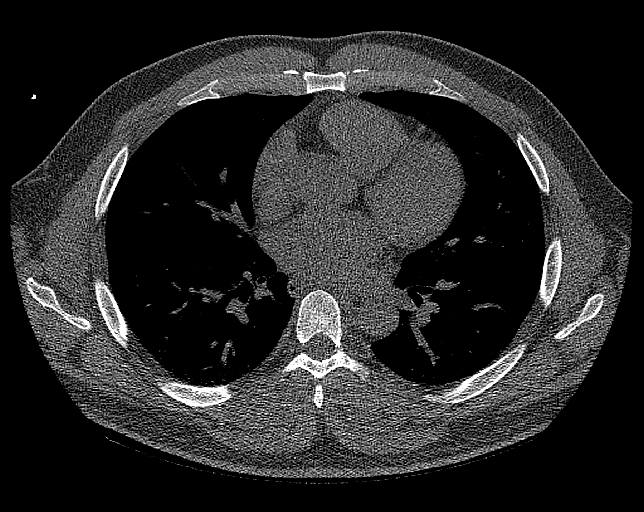
[im 66/80  vessel]
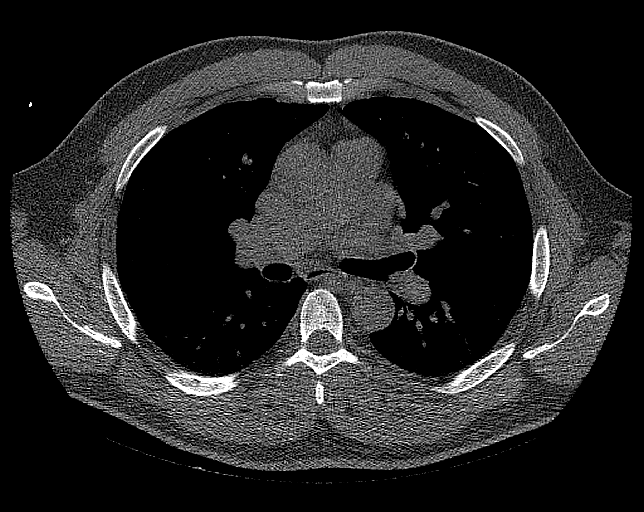

[14 of 20 positions shown; findings below may reference images not displayed]

FINDINGS: CORONARY CALCIUM SCORES:

Left Main: 0

LAD:

LCx:

RCA:

Total Agatston Score:

[HOSPITAL] percentile: 92

AORTA MEASUREMENTS:

Ascending Aorta: 36 mm

Descending Aorta: 26 mm

OTHER FINDINGS:

The heart size is within normal limits. No pericardial fluid is
identified. Visualized segments of the thoracic aorta and central
pulmonary arteries are normal in caliber. Visualized mediastinum and
hilar regions demonstrate no lymphadenopathy or masses. Visualized
lungs show no evidence of pulmonary edema, consolidation,
pneumothorax, nodule or pleural fluid. Visualized upper abdomen and
bony structures are unremarkable.
IMPRESSION: Coronary calcium score of 69.3 is at the 92nd percentile for the
patient's age, sex and race.

## 2023-10-31 ENCOUNTER — Other Ambulatory Visit: Payer: Self-pay | Admitting: Cardiology

## 2023-10-31 DIAGNOSIS — I10 Essential (primary) hypertension: Secondary | ICD-10-CM

## 2023-10-31 DIAGNOSIS — R931 Abnormal findings on diagnostic imaging of heart and coronary circulation: Secondary | ICD-10-CM

## 2023-10-31 DIAGNOSIS — E782 Mixed hyperlipidemia: Secondary | ICD-10-CM

## 2024-04-07 ENCOUNTER — Ambulatory Visit (HOSPITAL_COMMUNITY)
Admission: RE | Admit: 2024-04-07 | Discharge: 2024-04-07 | Disposition: A | Discharge status: Home / Self Care | Source: Ambulatory Visit | Attending: Internal Medicine | Admitting: Internal Medicine

## 2024-04-07 DIAGNOSIS — I340 Nonrheumatic mitral (valve) insufficiency: Secondary | ICD-10-CM | POA: Insufficient documentation

## 2024-04-07 LAB — ECHOCARDIOGRAM COMPLETE
Area-P 1/2: 3.47 cm2
MV M vel: 5.68 m/s
MV Peak grad: 128.9 mmHg
S' Lateral: 3.6 cm

## 2024-04-08 ENCOUNTER — Other Ambulatory Visit: Payer: Self-pay | Admitting: Cardiology

## 2024-04-08 DIAGNOSIS — I493 Ventricular premature depolarization: Secondary | ICD-10-CM

## 2024-04-12 ENCOUNTER — Ambulatory Visit: Payer: Self-pay | Admitting: Cardiology

## 2024-07-11 ENCOUNTER — Other Ambulatory Visit: Payer: Self-pay | Admitting: Cardiology

## 2024-07-11 DIAGNOSIS — I493 Ventricular premature depolarization: Secondary | ICD-10-CM

## 2024-07-16 ENCOUNTER — Encounter: Payer: Self-pay | Admitting: Cardiology

## 2024-07-16 DIAGNOSIS — I493 Ventricular premature depolarization: Secondary | ICD-10-CM

## 2024-07-16 DIAGNOSIS — I10 Essential (primary) hypertension: Secondary | ICD-10-CM

## 2024-07-17 MED ORDER — LABETALOL HCL 200 MG PO TABS: 200.0000 mg | ORAL_TABLET | Freq: Two times a day (BID) | ORAL | 0 refills | Status: DC

## 2024-07-17 MED ORDER — DILTIAZEM HCL ER COATED BEADS 240 MG PO CP24: 240.0000 mg | ORAL_CAPSULE | Freq: Every day | ORAL | 0 refills | Status: AC

## 2024-07-17 NOTE — Addendum Note (Signed)
 Addended by: BETHENA MOATS R on: 07/17/2024 11:17 AM   Modules accepted: Orders

## 2024-07-20 NOTE — Telephone Encounter (Signed)
 No need for an echo.  He had one in September 2025 with a follow up in October 2025.  But the follow up in Oct 2025 did not occur.   Dr. Ellora Varnum

## 2024-08-05 ENCOUNTER — Ambulatory Visit: Admitting: Cardiology

## 2024-08-05 NOTE — Progress Notes (Unsigned)
 " Cardiology Office Note:  .   Date:  08/05/2024  ID:  Phillip Fletcher, DOB 05-Jan-1971, MRN [id number] PCP:  Phillip Suzen HERO, NP  Former Cardiology Providers: None [facility] Providers Cardiologist:  Madonna Large, DO , [facility] (established care 06/26/2021) Electrophysiologist:  None  Click to update primary MD,subspecialty MD or APP then REFRESH:1}    No chief complaint on file.   History of Present Illness: Phillip Fletcher is a 54 y.o. African-American male whose past medical history and cardiovascular risk factors includes: Mitral regurgitation, PFO, premature ventricular contractions, hypertension, hyperlipidemia, erectile dysfunction, sleep apnea working on compliance with CPAP, family history of premature CAD.   Referred to the office back in December 2022 for evaluation of cardiovascular disease given his family history of premature CAD.  Coronary calcium  score noted a total CAC 69.3 AU placing him at the 92nd percentile and GXT noted excellent functional capacity for age and overall low risk study.  The exercise stress test noted PVCs at rest, stress, and within recovery.  He underwent a cardiac monitor and was noted to have a PVC burden of approximately 10%.  He was started on diltiazem  which he tolerated well and his palpitations essentially resolved.  In the past, transthoracic echo noted moderate/severe MR and follow up TEE noted atleast moderate MR due to mild bowing of anterior mitral leaflet, multiple jets, majority of regurgitation involves A2 and P2 scallops.   October 2024: Intermittent dyspnea on exertion but no change in intensity frequency or duration.  Home blood pressures ranging 120-140 mmHg.  And ran out of blood pressure pills.  Functional capacity remains relatively stable.  Follow-up echocardiogram recommended and reemphasized on monitoring ambulatory blood pressure readings to optimize his blood pressures.  January 2026: Patient presents today for  follow-up. Echocardiogram results reviewed from September 2025. Since last office visit Phillip Fletcher denies any anginal chest pain or heart failure symptoms.   No hospitalizations or urgent care visits for cardiovascular reasons.   He has been compliant with his medical therapy and endorses no concerns.  *** Weight  Physical endurance remains stable *** Home SBP ranges between ***    FUNCTIONAL STATUS: Runs at least 2 miles a day in addition to working out additional 30 to 60 minutes 6 days a week.  Review of Systems: .   Review of Systems  Cardiovascular:  Negative for chest pain, dyspnea on exertion and leg swelling.    Studies Reviewed:   EKG: 10/08/2022: Sinus rhythm, 74 bpm, LVH per voltage criteria, without underlying ischemia or injury pattern.   Echocardiogram: 07/03/2021: LVEF 67%, moderately dilated left atrium, small PFO cannot be excluded, mild mitral valve prolapse, moderate to severe MR.   TEE  09/13/2021: LVEF 60-65%,Native mitral valve, mild leaflet thickness, mild bowing of anterior mitral leaflet, multiple jets, majority of regurgitation involves A2 and P2 scallops. Moderate mitral valve regurgitation. No evidence of mitral stenosis.  See report for additional details   TTE  10/01/2022: LVEF 55%, grade 1 diastolic dysfunction, mild LAE, Mild myxomatous degeneration or mitral valve. Mild to moderate, posteriorly directed mitral regurgitation.    September 2025: LVEF 55 to 60%, 57% by 3D volumes, average GLS -21.9%, mild MR with eccentric jet directed posteriorly, normal left atrial size, ascending aorta 39 mm.  See report for additional details   Stress Testing: Exercise treadmill stress test 07/18/2021: Exercise treadmill stress test performed using Bruce protocol. Patient reached 13.5 METS, and 88% of age predicted maximum heart rate.  Exercise capacity was excellent. No chest pain reported. Normal heart rate and hemodynamic response. Stress EKG revealed no ischemic changes.  Occasional PVC's seen during rest, stress, and recovery.  Low risk study.    Heart Catheterization: None   CT Cardiac Scoring: 08/11/2021 Total CAC 69.3 AU, 92nd percentile for patient's age, sex, and race.   3 day extended Holter monitor: August 24, 2021- August 28, 2021 Dominant rhythm normal sinus. Heart rate 29-146 bpm.  Avg HR 81 bpm. Minimum heart rate : 29 bpm on 08/26/2021 at 4:56 AM (sinus bradycardia, second-degree type I AV block with PVCs). No atrial fibrillation, supraventricular tachycardia, high grade AV block, pauses (3 seconds or longer). Total ventricular ectopic burden 10.1% (predominantly isolated PVCs). Total supraventricular ectopic burden <1%. Patient triggered events: 0.   Cardiac monitor (Zio Patch): 09/07/2022 - 09/14/2022 Dominant rhythm sinus. Heart rate 26-150 bpm.  Avg HR 78 bpm. Minimum heart rate 26 bpm occurred on 09/09/2022 and 6:58 PM -underlying rhythm sinus bradycardia with second-degree type I AV block with rare PVC. No atrial fibrillation, supraventricular tachycardia, ventricular tachycardia, high grade AV block, pauses (3 seconds or longer). Total ventricular ectopic burden 5.1%. Total supraventricular ectopic burden <1%. Patient triggered events: 0.   RADIOLOGY: NA  Risk Assessment/Calculations:   NA  Labs:       Latest Ref Rng & Units 09/06/2021    4:10 PM 03/05/2017   12:36 AM  CBC  WBC 3.4 - 10.8 x10E3/uL 5.2  6.1   Hemoglobin 13.0 - 17.7 g/dL 85.8  87.3   Hematocrit 37.5 - 51.0 % 41.9  37.1   Platelets 150 - 450 x10E3/uL 242  238        Latest Ref Rng & Units 04/18/2023    7:50 AM 09/06/2021    4:10 PM 04/05/2017    9:35 AM  BMP  Glucose 70 - 99 mg/dL 88  896  97   BUN 6 - 24 mg/dL 17  21  17    Creatinine 0.76 - 1.27 mg/dL 8.88  8.68  8.92   BUN/Creat Ratio 9 - 20 15  16     Sodium 134 - 144 mmol/L 138  142  138   Potassium 3.5 - 5.2 mmol/L 4.0  3.8  4.0   Chloride 96 - 106 mmol/L 101  102  105   CO2 20 - 29 mmol/L 24   25  26    Calcium  8.7 - 10.2 mg/dL 9.8  89.8  9.6       Latest Ref Rng & Units 04/18/2023    7:50 AM 09/06/2021    4:10 PM 04/05/2017    9:35 AM  CMP  Glucose 70 - 99 mg/dL 88  896  97   BUN 6 - 24 mg/dL 17  21  17    Creatinine 0.76 - 1.27 mg/dL 8.88  8.68  8.92   Sodium 134 - 144 mmol/L 138  142  138   Potassium 3.5 - 5.2 mmol/L 4.0  3.8  4.0   Chloride 96 - 106 mmol/L 101  102  105   CO2 20 - 29 mmol/L 24  25  26    Calcium  8.7 - 10.2 mg/dL 9.8  89.8  9.6     No results found for: CHOL, HDL, LDLCALC, LDLDIRECT, TRIG, CHOLHDL No results for input(s): LIPOA in the last 8760 hours. No components found for: NTPROBNP No results for input(s): PROBNP in the last 8760 hours. No results for input(s): TSH in the last 8760 hours.  Physical  Exam:    There were no vitals filed for this visit.  There is no height or weight on file to calculate BMI. Wt Readings from Last 3 Encounters:  04/24/23 236 lb 12.8 oz (107.4 kg)  01/30/23 235 lb (106.6 kg)  01/22/23 239 lb (108.4 kg)    Physical Exam  Neck: No JVD present.  Cardiovascular: Regular rhythm, intact distal pulses and normal pulses. Exam reveals no gallop.  Murmur heard. Mid to late systolic murmur is present with a grade of 3/6 at the apex. Pulmonary/Chest: Effort normal and breath sounds normal.  Abdominal: Soft. Bowel sounds are normal.  Musculoskeletal:        General: No edema.     Impression & Recommendation(s):  Impression: No diagnosis found.    Recommendation(s):  Premature ventricular contraction February 2023 PVC burden 10%. March 2024 PVC burden 5.1%. ***  Agatston coronary artery calcium  score less than 100 Total CAC 69.3 AU, 92nd percentile.  Continue aspirin  and statin therapy. Does not endorse angina pectoris. Monitor for now. No change in overall physical endurance. ***  Nonrheumatic mitral valve regurgitation Asymptomatic. 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   Reemphasized importance of blood pressure management. ***  Primary hypertension *** Given his valvular heart disease would like to keep his systolic blood pressures are 120 mmHg. May consider addition of spironolactone if additional medical therapy is warranted  OSA on CPAP Complaints?***   Orders Placed:  No orders of the defined types were placed in this encounter.  Final Medication List:    No orders of the defined types were placed in this encounter.   There are no discontinued medications.    Current Outpatient Medications:    aspirin  EC 81 MG tablet, Take 1 tablet (81 mg total) by mouth daily. Swallow whole., Disp: 30 tablet, Rfl: 11   atorvastatin  (LIPITOR) 20 MG tablet, TAKE 1 TABLET(20 MG) BY MOUTH AT BEDTIME, Disp: 90 tablet, Rfl: 3   diltiazem  (CARDIZEM  CD) 240 MG 24 hr capsule, Take 1 capsule (240 mg total) by mouth daily., Disp: 30 capsule, Rfl: 0   labetalol  (NORMODYNE ) 200 MG tablet, Take 1 tablet (200 mg total) by mouth 2 (two) times daily., Disp: 60 tablet, Rfl: 0   Multiple Vitamin (MULTIVITAMIN) tablet, Take 1 tablet by mouth daily., Disp: , Rfl:    olmesartan -hydrochlorothiazide  (BENICAR  HCT) 40-25 MG tablet, TAKE 1 TABLET BY MOUTH EVERY MORNING. STOP TAKING LOSARTAN  AND HYDROCHLOROTHIAZIDE , Disp: 90 tablet, Rfl: 2   Omega-3 Fatty Acids (FISH OIL PO), Take by mouth., Disp: , Rfl:    omeprazole (PRILOSEC) 20 MG capsule, Take 20 mg by mouth daily., Disp: , Rfl:    tadalafil (CIALIS) 5 MG tablet, Take 1 tablet by mouth daily., Disp: , Rfl:   Consent:      NA    Disposition:   ***  His questions and concerns were addressed to his satisfaction. He voices understanding of the recommendations provided during this encounter.    Signed, Madonna Michele HAS, [facility]  A Division of [facility] [address] [place], [place]  [address]   08/05/2024 9:02 AM "

## 2024-08-07 ENCOUNTER — Ambulatory Visit: Attending: Cardiology | Admitting: Cardiology

## 2024-08-07 ENCOUNTER — Encounter: Payer: Self-pay | Admitting: Cardiology

## 2024-08-07 VITALS — BP 158/108 | HR 75 | Resp 16 | Ht 71.0 in | Wt 242.8 lb

## 2024-08-07 DIAGNOSIS — G4733 Obstructive sleep apnea (adult) (pediatric): Secondary | ICD-10-CM

## 2024-08-07 DIAGNOSIS — I493 Ventricular premature depolarization: Secondary | ICD-10-CM

## 2024-08-07 DIAGNOSIS — R931 Abnormal findings on diagnostic imaging of heart and coronary circulation: Secondary | ICD-10-CM

## 2024-08-07 DIAGNOSIS — I340 Nonrheumatic mitral (valve) insufficiency: Secondary | ICD-10-CM | POA: Diagnosis not present

## 2024-08-07 DIAGNOSIS — I10 Essential (primary) hypertension: Secondary | ICD-10-CM

## 2024-08-07 DIAGNOSIS — E782 Mixed hyperlipidemia: Secondary | ICD-10-CM

## 2024-08-07 MED ORDER — LABETALOL HCL 400 MG PO TABS: 400.0000 mg | ORAL_TABLET | Freq: Two times a day (BID) | ORAL | 3 refills | Status: AC

## 2024-08-07 MED ORDER — HYDRALAZINE HCL 25 MG PO TABS: 25.0000 mg | ORAL_TABLET | Freq: Every day | ORAL | 3 refills | Status: AC

## 2024-08-07 NOTE — Progress Notes (Signed)
 " Cardiology Office Note:  .   Date:  08/07/2024  ID:  Phillip Fletcher, DOB 11/26/1970, MRN [id number] PCP:  Cristopher Suzen HERO, NP  Former Cardiology Providers: None [facility] Providers Cardiologist:  Madonna Large, DO , [facility] (established care 06/26/2021) Electrophysiologist:  None  Click to update primary MD,subspecialty MD or APP then REFRESH:1}    Chief Complaint  Patient presents with   Follow-up    1 year follow up - MR    History of Present Illness: Phillip Fletcher is a 54 y.o. African-American male whose past medical history and cardiovascular risk factors includes: Mitral regurgitation, PFO, premature ventricular contractions, hypertension, hyperlipidemia, erectile dysfunction, sleep apnea working on compliance with CPAP, family history of premature CAD.   Referred to the office back in December 2022 for evaluation of cardiovascular disease given his family history of premature CAD.  Coronary calcium  score noted a total CAC 69.3 AU placing him at the 92nd percentile and GXT noted excellent functional capacity for age and overall low risk study.  The exercise stress test noted PVCs at rest, stress, and within recovery.  He underwent a cardiac monitor and was noted to have a PVC burden of approximately 10%.  He was started on diltiazem  which he tolerated well and his palpitations essentially resolved.  In the past, transthoracic echo noted moderate/severe MR and follow up TEE noted atleast moderate MR due to mild bowing of anterior mitral leaflet, multiple jets, majority of regurgitation involves A2 and P2 scallops.   October 2024: Intermittent dyspnea on exertion but no change in intensity frequency or duration.  Home blood pressures ranging 120-140 mmHg.  And ran out of blood pressure pills.  Functional capacity remains relatively stable.  Follow-up echocardiogram recommended and reemphasized on monitoring ambulatory blood pressure readings to optimize his blood  pressures.  January 2026: Patient presents today for follow-up. Echocardiogram results reviewed from September 2025. Since last office visit Shawnte denies any anginal chest pain or heart failure symptoms.   No hospitalizations or urgent care visits for cardiovascular reasons.   He has been compliant with his medical therapy and endorses no concerns.  Home BP was running higher around November 2025 - he increased Labetalol  to 400mg  bid. Likely due to increase salty foods over the holidays.  Physical endurance remains stable -running 2-3 miles 6 days a week or ride bike for 7-8 miles day.   FUNCTIONAL STATUS: Runs at least 2 miles a day in addition to working out additional 30 to 60 minutes 6 days a week.  Review of Systems: .   Review of Systems  Cardiovascular:  Negative for chest pain, dyspnea on exertion and leg swelling.    Studies Reviewed:   EKG Interpretation Date/Time:  Friday August 07 2024 08:12:07 EST Ventricular Rate:  72 PR Interval:  170 QRS Duration:  94 QT Interval:  382 QTC Calculation: 418 R Axis:   -26  Text Interpretation: Normal sinus rhythm Incomplete right bundle branch block Moderate voltage criteria for LVH, may be normal variant ( R in aVL , Cornell product ) When compared with ECG of 05-Apr-2017 07:46, T wave amplitude has decreased in Lateral leads Confirmed by Large Madonna [phone]) on 08/07/2024 8:20:12 AM   Echocardiogram: 07/03/2021: LVEF 67%, moderately dilated left atrium, small PFO cannot be excluded, mild mitral valve prolapse, moderate to severe MR.   TEE  09/13/2021: LVEF 60-65%,Native mitral valve, mild leaflet thickness, mild bowing of anterior mitral leaflet, multiple jets, majority of  regurgitation involves A2 and P2 scallops. Moderate mitral valve regurgitation. No evidence of mitral stenosis.  See report for additional details   TTE  10/01/2022: LVEF 55%, grade 1 diastolic dysfunction, mild LAE, Mild myxomatous degeneration or mitral valve.  Mild to moderate, posteriorly directed mitral regurgitation.    September 2025: LVEF 55 to 60%, 57% by 3D volumes, average GLS -21.9%, mild MR with eccentric jet directed posteriorly, normal left atrial size, ascending aorta 39 mm.  See report for additional details   Stress Testing: Exercise treadmill stress test 07/18/2021: Exercise treadmill stress test performed using Bruce protocol. Patient reached 13.5 METS, and 88% of age predicted maximum heart rate. Exercise capacity was excellent. No chest pain reported. Normal heart rate and hemodynamic response. Stress EKG revealed no ischemic changes. Occasional PVC's seen during rest, stress, and recovery.  Low risk study.    Heart Catheterization: None   CT Cardiac Scoring: 08/11/2021 Total CAC 69.3 AU, 92nd percentile for patient's age, sex, and race.   3 day extended Holter monitor: August 24, 2021- August 28, 2021 Dominant rhythm normal sinus. Heart rate 29-146 bpm.  Avg HR 81 bpm. Minimum heart rate : 29 bpm on 08/26/2021 at 4:56 AM (sinus bradycardia, second-degree type I AV block with PVCs). No atrial fibrillation, supraventricular tachycardia, high grade AV block, pauses (3 seconds or longer). Total ventricular ectopic burden 10.1% (predominantly isolated PVCs). Total supraventricular ectopic burden <1%. Patient triggered events: 0.   Cardiac monitor (Zio Patch): 09/07/2022 - 09/14/2022 Dominant rhythm sinus. Heart rate 26-150 bpm.  Avg HR 78 bpm. Minimum heart rate 26 bpm occurred on 09/09/2022 and 6:58 PM -underlying rhythm sinus bradycardia with second-degree type I AV block with rare PVC. No atrial fibrillation, supraventricular tachycardia, ventricular tachycardia, high grade AV block, pauses (3 seconds or longer). Total ventricular ectopic burden 5.1%. Total supraventricular ectopic burden <1%. Patient triggered events: 0.   RADIOLOGY: NA  Risk Assessment/Calculations:   NA  Labs:       Latest Ref Rng & Units  09/06/2021    4:10 PM 03/05/2017   12:36 AM  CBC  WBC 3.4 - 10.8 x10E3/uL 5.2  6.1   Hemoglobin 13.0 - 17.7 g/dL 85.8  87.3   Hematocrit 37.5 - 51.0 % 41.9  37.1   Platelets 150 - 450 x10E3/uL 242  238        Latest Ref Rng & Units 04/18/2023    7:50 AM 09/06/2021    4:10 PM 04/05/2017    9:35 AM  BMP  Glucose 70 - 99 mg/dL 88  896  97   BUN 6 - 24 mg/dL 17  21  17    Creatinine 0.76 - 1.27 mg/dL 8.88  8.68  8.92   BUN/Creat Ratio 9 - 20 15  16     Sodium 134 - 144 mmol/L 138  142  138   Potassium 3.5 - 5.2 mmol/L 4.0  3.8  4.0   Chloride 96 - 106 mmol/L 101  102  105   CO2 20 - 29 mmol/L 24  25  26    Calcium  8.7 - 10.2 mg/dL 9.8  89.8  9.6       Latest Ref Rng & Units 04/18/2023    7:50 AM 09/06/2021    4:10 PM 04/05/2017    9:35 AM  CMP  Glucose 70 - 99 mg/dL 88  896  97   BUN 6 - 24 mg/dL 17  21  17    Creatinine 0.76 - 1.27 mg/dL 8.88  8.68  1.07   Sodium 134 - 144 mmol/L 138  142  138   Potassium 3.5 - 5.2 mmol/L 4.0  3.8  4.0   Chloride 96 - 106 mmol/L 101  102  105   CO2 20 - 29 mmol/L 24  25  26    Calcium  8.7 - 10.2 mg/dL 9.8  89.8  9.6     No results found for: CHOL, HDL, LDLCALC, LDLDIRECT, TRIG, CHOLHDL No results for input(s): LIPOA in the last 8760 hours. No components found for: NTPROBNP No results for input(s): PROBNP in the last 8760 hours. No results for input(s): TSH in the last 8760 hours.  Physical Exam:    Today's Vitals   08/07/24 0807  BP: (!) 158/108  Pulse: 75  Resp: 16  SpO2: 97%  Weight: 242 lb 12.8 oz (110.1 kg)  Height: 5' 11 (1.803 m)    Body mass index is 33.86 kg/m. Wt Readings from Last 3 Encounters:  08/07/24 242 lb 12.8 oz (110.1 kg)  04/24/23 236 lb 12.8 oz (107.4 kg)  01/30/23 235 lb (106.6 kg)    Physical Exam  Neck: No JVD present.  Cardiovascular: Regular rhythm, intact distal pulses and normal pulses. Exam reveals no gallop.  Murmur heard. Mid to late systolic murmur is present with a grade of  3/6 at the apex. Pulmonary/Chest: Effort normal and breath sounds normal.  Abdominal: Soft. Bowel sounds are normal.  Musculoskeletal:        General: No edema.     Impression & Recommendation(s):  Impression:   ICD-10-CM   1. Nonrheumatic mitral valve regurgitation  I34.0 EKG 12-Lead    Labetalol  HCl 400 MG TABS    hydrALAZINE (APRESOLINE) 25 MG tablet    2. Premature ventricular contraction  I49.3     3. Agatston coronary artery calcium  score less than 100  R93.1     4. Benign hypertension  I10 Labetalol  HCl 400 MG TABS    hydrALAZINE (APRESOLINE) 25 MG tablet    5. Mixed hyperlipidemia  E78.2     6. OSA (obstructive sleep apnea)  G47.33         Recommendation(s):  Nonrheumatic mitral valve regurgitation Asymptomatic. 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   Reemphasized importance of blood pressure management.  Premature ventricular contraction February 2023 PVC burden 10%. March 2024 PVC burden 5.1%. Continue Cardizem  340 mg p.o. daily. EKG today illustrates sinus rhythm without ectopy  Agatston coronary artery calcium  score less than 100 Total CAC 69.3 AU, 92nd percentile.  Continue aspirin  and statin therapy. Does not endorse angina pectoris. Monitor for now. No change in overall physical endurance. No additional testing warranted at this time in an asymptomatic male  Primary hypertension Office blood pressure is not at goal.  Patient needs refills. As mentioned above recently went up on labetalol  from 200 mg p.o. twice daily to 400 mg p.o. twice daily Increase labetalol  to 400 mg p.o. twice daily. Continue Benicar /hydrochlorothiazide  40/25 mg p.o. daily. Continue Cardizem  240 mg p.o. daily. Start hydralazine 25 mg p.o. twice daily. Recommended goal SBP between 120-130 mmHg If patient needs more blood pressure medication titration consider Pharm.D.  consult for closer follow-up  OSA on CPAP Endorses compliance with device therapy  Orders Placed:  Orders Placed This Encounter  Procedures   EKG 12-Lead   Final Medication List:    Meds ordered this encounter  Medications  Labetalol  HCl 400 MG TABS    Sig: Take 400 mg by mouth 2 (two) times daily.    Dispense:  180 tablet    Refill:  3   hydrALAZINE (APRESOLINE) 25 MG tablet    Sig: Take 1 tablet (25 mg total) by mouth daily.    Dispense:  90 tablet    Refill:  3    Medications Discontinued During This Encounter  Medication Reason   labetalol  (NORMODYNE ) 200 MG tablet    labetalol  (NORMODYNE ) 200 MG tablet       Current Outpatient Medications:    aspirin  EC 81 MG tablet, Take 1 tablet (81 mg total) by mouth daily. Swallow whole., Disp: 30 tablet, Rfl: 11   atorvastatin  (LIPITOR) 20 MG tablet, TAKE 1 TABLET(20 MG) BY MOUTH AT BEDTIME, Disp: 90 tablet, Rfl: 3   diltiazem  (CARDIZEM  CD) 240 MG 24 hr capsule, Take 1 capsule (240 mg total) by mouth daily., Disp: 30 capsule, Rfl: 0   hydrALAZINE (APRESOLINE) 25 MG tablet, Take 1 tablet (25 mg total) by mouth daily., Disp: 90 tablet, Rfl: 3   Labetalol  HCl 400 MG TABS, Take 400 mg by mouth 2 (two) times daily., Disp: 180 tablet, Rfl: 3   Multiple Vitamin (MULTIVITAMIN) tablet, Take 1 tablet by mouth daily., Disp: , Rfl:    olmesartan -hydrochlorothiazide  (BENICAR  HCT) 40-25 MG tablet, TAKE 1 TABLET BY MOUTH EVERY MORNING. STOP TAKING LOSARTAN  AND HYDROCHLOROTHIAZIDE , Disp: 90 tablet, Rfl: 2   Omega-3 Fatty Acids (FISH OIL PO), Take by mouth., Disp: , Rfl:    omeprazole (PRILOSEC) 20 MG capsule, Take 20 mg by mouth daily., Disp: , Rfl:    tadalafil (CIALIS) 5 MG tablet, Take 1 tablet by mouth daily., Disp: , Rfl:   Consent:      NA    Disposition:   1 year follow-up sooner if needed  His questions and concerns were addressed to his satisfaction. He voices understanding of the recommendations provided during this encounter.     Signed, Madonna Michele HAS, [facility]  A Division of [facility] [address], [place], [place] [address]   08/07/2024 12:34 PM "

## 2024-08-07 NOTE — Patient Instructions (Signed)
 Medication Instructions:  INCREASE Labetolol (Nomrodyne) to 400 mg. Take one (1) tablet by mouth twice daily.   START taking Hyrdalazine () 25 mg. Take one (1) tablet by mouth once daily. *If you need a refill on your cardiac medications before your next appointment, please call your pharmacy*  Lab Work: None ordered If you have labs (blood work) drawn today and your tests are completely normal, you will receive your results only by: MyChart Message (if you have MyChart) OR A paper copy in the mail If you have any lab test that is abnormal or we need to change your treatment, we will call you to review the results.  Testing/Procedures: None ordered  Follow-Up: At [facility], you and your health needs are our priority.  As part of our continuing mission to provide you with exceptional heart care, our providers are all part of one team.  This team includes your primary Cardiologist (physician) and Advanced Practice Providers or APPs (Physician Assistants and Nurse Practitioners) who all work together to provide you with the care you need, when you need it.  Your next appointment:   1 year(s)  Provider:   Madonna Large, DO    We recommend signing up for the patient portal called MyChart.  Sign up information is provided on this After Visit Summary.  MyChart is used to connect with patients for Virtual Visits (Telemedicine).  Patients are able to view lab/test results, encounter notes, upcoming appointments, etc.  Non-urgent messages can be sent to your provider as well.   To learn more about what you can do with MyChart, go to [email or web address].
